# Patient Record
Sex: Male | Born: 1935 | Race: White | Hispanic: No | Smoking: Never smoker
Health system: Southern US, Community
[De-identification: ages and names within clinical notes are randomized; demographics above are authoritative.]

## PROBLEM LIST (undated history)

## (undated) DIAGNOSIS — K219 Gastro-esophageal reflux disease without esophagitis: Secondary | ICD-10-CM

## (undated) DIAGNOSIS — M1990 Unspecified osteoarthritis, unspecified site: Secondary | ICD-10-CM

## (undated) DIAGNOSIS — N400 Enlarged prostate without lower urinary tract symptoms: Secondary | ICD-10-CM

## (undated) DIAGNOSIS — F329 Major depressive disorder, single episode, unspecified: Secondary | ICD-10-CM

## (undated) DIAGNOSIS — I4891 Unspecified atrial fibrillation: Secondary | ICD-10-CM

## (undated) DIAGNOSIS — E119 Type 2 diabetes mellitus without complications: Secondary | ICD-10-CM

## (undated) DIAGNOSIS — N390 Urinary tract infection, site not specified: Secondary | ICD-10-CM

## (undated) DIAGNOSIS — I10 Essential (primary) hypertension: Secondary | ICD-10-CM

## (undated) DIAGNOSIS — F32A Depression, unspecified: Secondary | ICD-10-CM

## (undated) DIAGNOSIS — I2699 Other pulmonary embolism without acute cor pulmonale: Secondary | ICD-10-CM

## (undated) DIAGNOSIS — E785 Hyperlipidemia, unspecified: Secondary | ICD-10-CM

## (undated) DIAGNOSIS — H269 Unspecified cataract: Secondary | ICD-10-CM

## (undated) DIAGNOSIS — C61 Malignant neoplasm of prostate: Secondary | ICD-10-CM

## (undated) DIAGNOSIS — K4090 Unilateral inguinal hernia, without obstruction or gangrene, not specified as recurrent: Secondary | ICD-10-CM

## (undated) HISTORY — DX: Major depressive disorder, single episode, unspecified: F32.9

## (undated) HISTORY — DX: Malignant neoplasm of prostate: C61

## (undated) HISTORY — DX: Unilateral inguinal hernia, without obstruction or gangrene, not specified as recurrent: K40.90

## (undated) HISTORY — PX: OTHER SURGICAL HISTORY: SHX169

## (undated) HISTORY — DX: Essential (primary) hypertension: I10

## (undated) HISTORY — DX: Hyperlipidemia, unspecified: E78.5

## (undated) HISTORY — DX: Unspecified atrial fibrillation: I48.91

## (undated) HISTORY — DX: Unspecified cataract: H26.9

## (undated) HISTORY — PX: PROSTATE SURGERY: SHX751

## (undated) HISTORY — PX: TOOTH EXTRACTION: SUR596

## (undated) HISTORY — DX: Depression, unspecified: F32.A

## (undated) HISTORY — DX: Unspecified osteoarthritis, unspecified site: M19.90

## (undated) HISTORY — DX: Type 2 diabetes mellitus without complications: E11.9

## (undated) HISTORY — DX: Gastro-esophageal reflux disease without esophagitis: K21.9

## (undated) HISTORY — DX: Urinary tract infection, site not specified: N39.0

## (undated) HISTORY — DX: Benign prostatic hyperplasia without lower urinary tract symptoms: N40.0

## (undated) HISTORY — PX: EYE SURGERY: SHX253

## (undated) HISTORY — DX: Other pulmonary embolism without acute cor pulmonale: I26.99

---

## 1958-07-01 HISTORY — PX: OTHER SURGICAL HISTORY: SHX169

## 2004-02-09 ENCOUNTER — Other Ambulatory Visit: Payer: Self-pay

## 2004-03-31 ENCOUNTER — Encounter: Payer: Self-pay | Admitting: Internal Medicine

## 2004-05-01 ENCOUNTER — Encounter: Payer: Self-pay | Admitting: Internal Medicine

## 2004-05-31 ENCOUNTER — Encounter: Payer: Self-pay | Admitting: Internal Medicine

## 2004-07-01 ENCOUNTER — Encounter: Payer: Self-pay | Admitting: Internal Medicine

## 2004-08-09 ENCOUNTER — Encounter: Payer: Self-pay | Admitting: Internal Medicine

## 2004-08-29 ENCOUNTER — Encounter: Payer: Self-pay | Admitting: Internal Medicine

## 2004-09-29 ENCOUNTER — Encounter: Payer: Self-pay | Admitting: Internal Medicine

## 2004-10-29 ENCOUNTER — Encounter: Payer: Self-pay | Admitting: Internal Medicine

## 2004-11-29 ENCOUNTER — Encounter: Payer: Self-pay | Admitting: Internal Medicine

## 2004-12-29 ENCOUNTER — Encounter: Payer: Self-pay | Admitting: Internal Medicine

## 2005-01-29 ENCOUNTER — Encounter: Payer: Self-pay | Admitting: Internal Medicine

## 2005-03-01 ENCOUNTER — Encounter: Payer: Self-pay | Admitting: Internal Medicine

## 2005-04-22 ENCOUNTER — Encounter: Payer: Self-pay | Admitting: Internal Medicine

## 2005-05-01 ENCOUNTER — Encounter: Payer: Self-pay | Admitting: Internal Medicine

## 2005-05-31 ENCOUNTER — Encounter: Payer: Self-pay | Admitting: Internal Medicine

## 2005-07-01 ENCOUNTER — Encounter: Payer: Self-pay | Admitting: Cardiology

## 2005-07-22 ENCOUNTER — Encounter: Payer: Self-pay | Admitting: Cardiology

## 2005-08-01 ENCOUNTER — Encounter: Payer: Self-pay | Admitting: Cardiology

## 2005-08-29 ENCOUNTER — Encounter: Payer: Self-pay | Admitting: Cardiology

## 2005-10-01 ENCOUNTER — Other Ambulatory Visit: Payer: Self-pay

## 2005-10-01 ENCOUNTER — Emergency Department: Payer: Self-pay | Admitting: Emergency Medicine

## 2005-10-03 ENCOUNTER — Encounter: Payer: Self-pay | Admitting: Cardiology

## 2005-10-29 ENCOUNTER — Encounter: Payer: Self-pay | Admitting: Cardiology

## 2005-11-29 ENCOUNTER — Encounter: Payer: Self-pay | Admitting: Cardiology

## 2005-12-29 ENCOUNTER — Encounter: Payer: Self-pay | Admitting: Cardiology

## 2006-01-29 ENCOUNTER — Encounter: Payer: Self-pay | Admitting: Cardiology

## 2006-03-04 ENCOUNTER — Encounter: Payer: Self-pay | Admitting: Family

## 2006-03-28 DIAGNOSIS — E785 Hyperlipidemia, unspecified: Secondary | ICD-10-CM | POA: Insufficient documentation

## 2006-03-31 ENCOUNTER — Encounter: Payer: Self-pay | Admitting: Family

## 2006-05-09 ENCOUNTER — Encounter: Payer: Self-pay | Admitting: Family

## 2006-05-28 ENCOUNTER — Other Ambulatory Visit: Payer: Self-pay

## 2006-05-29 ENCOUNTER — Inpatient Hospital Stay: Payer: Self-pay | Admitting: Internal Medicine

## 2006-05-31 ENCOUNTER — Encounter: Payer: Self-pay | Admitting: Family

## 2006-07-01 ENCOUNTER — Encounter: Payer: Self-pay | Admitting: Family

## 2006-08-01 ENCOUNTER — Encounter: Payer: Self-pay | Admitting: Family

## 2006-08-30 ENCOUNTER — Encounter: Payer: Self-pay | Admitting: Family

## 2006-09-30 ENCOUNTER — Encounter: Payer: Self-pay | Admitting: Family

## 2006-10-30 ENCOUNTER — Encounter: Payer: Self-pay | Admitting: Family

## 2006-11-30 ENCOUNTER — Encounter: Payer: Self-pay | Admitting: Family

## 2006-12-30 ENCOUNTER — Encounter: Payer: Self-pay | Admitting: Family

## 2007-01-30 ENCOUNTER — Encounter: Payer: Self-pay | Admitting: Family

## 2007-03-02 ENCOUNTER — Encounter: Payer: Self-pay | Admitting: Family

## 2007-04-01 ENCOUNTER — Encounter: Payer: Self-pay | Admitting: Family

## 2007-06-02 ENCOUNTER — Ambulatory Visit: Payer: Self-pay | Admitting: Family Medicine

## 2007-07-06 ENCOUNTER — Encounter: Payer: Self-pay | Admitting: Family Medicine

## 2007-08-02 ENCOUNTER — Encounter: Payer: Self-pay | Admitting: Family Medicine

## 2007-08-30 ENCOUNTER — Encounter: Payer: Self-pay | Admitting: Family Medicine

## 2007-10-07 ENCOUNTER — Encounter: Payer: Self-pay | Admitting: Family Medicine

## 2007-10-30 ENCOUNTER — Encounter: Payer: Self-pay | Admitting: Family Medicine

## 2007-11-30 ENCOUNTER — Encounter: Payer: Self-pay | Admitting: Family Medicine

## 2007-12-30 ENCOUNTER — Encounter: Payer: Self-pay | Admitting: Family Medicine

## 2008-01-30 ENCOUNTER — Encounter: Payer: Self-pay | Admitting: Family Medicine

## 2008-03-01 ENCOUNTER — Encounter: Payer: Self-pay | Admitting: Family Medicine

## 2008-03-31 ENCOUNTER — Encounter: Payer: Self-pay | Admitting: Family Medicine

## 2008-05-01 ENCOUNTER — Encounter: Payer: Self-pay | Admitting: Family Medicine

## 2008-05-18 ENCOUNTER — Ambulatory Visit: Payer: Self-pay | Admitting: Family Medicine

## 2008-05-31 ENCOUNTER — Encounter: Payer: Self-pay | Admitting: Family Medicine

## 2008-07-01 ENCOUNTER — Ambulatory Visit: Payer: Self-pay | Admitting: Family Medicine

## 2008-07-29 ENCOUNTER — Encounter: Payer: Self-pay | Admitting: Family Medicine

## 2008-08-01 ENCOUNTER — Encounter: Payer: Self-pay | Admitting: Family Medicine

## 2008-08-30 ENCOUNTER — Encounter: Payer: Self-pay | Admitting: Family Medicine

## 2008-09-29 ENCOUNTER — Encounter: Payer: Self-pay | Admitting: Family Medicine

## 2008-10-29 ENCOUNTER — Encounter: Payer: Self-pay | Admitting: Family Medicine

## 2008-11-29 ENCOUNTER — Encounter: Payer: Self-pay | Admitting: Family Medicine

## 2008-12-29 ENCOUNTER — Encounter: Payer: Self-pay | Admitting: Family Medicine

## 2009-01-29 ENCOUNTER — Encounter: Payer: Self-pay | Admitting: Family Medicine

## 2009-03-01 ENCOUNTER — Encounter: Payer: Self-pay | Admitting: Family Medicine

## 2009-03-31 ENCOUNTER — Encounter: Payer: Self-pay | Admitting: Family Medicine

## 2009-05-01 ENCOUNTER — Encounter: Payer: Self-pay | Admitting: Family Medicine

## 2009-05-11 DIAGNOSIS — E7800 Pure hypercholesterolemia, unspecified: Secondary | ICD-10-CM | POA: Insufficient documentation

## 2009-05-31 ENCOUNTER — Encounter: Payer: Self-pay | Admitting: Family Medicine

## 2009-07-03 ENCOUNTER — Encounter: Payer: Self-pay | Admitting: Family Medicine

## 2009-08-01 ENCOUNTER — Encounter: Payer: Self-pay | Admitting: Family Medicine

## 2009-08-29 ENCOUNTER — Encounter: Payer: Self-pay | Admitting: Family Medicine

## 2009-09-29 ENCOUNTER — Encounter: Payer: Self-pay | Admitting: Family Medicine

## 2009-10-29 ENCOUNTER — Encounter: Payer: Self-pay | Admitting: Family Medicine

## 2009-11-28 ENCOUNTER — Ambulatory Visit: Payer: Self-pay | Admitting: Ophthalmology

## 2009-12-05 ENCOUNTER — Encounter: Payer: Self-pay | Admitting: Family Medicine

## 2009-12-29 ENCOUNTER — Encounter: Payer: Self-pay | Admitting: Family Medicine

## 2010-01-29 ENCOUNTER — Other Ambulatory Visit: Payer: Self-pay | Admitting: Family Medicine

## 2010-03-01 ENCOUNTER — Other Ambulatory Visit: Payer: Self-pay | Admitting: Family Medicine

## 2010-03-31 ENCOUNTER — Other Ambulatory Visit: Payer: Self-pay | Admitting: Family Medicine

## 2010-05-01 ENCOUNTER — Other Ambulatory Visit: Payer: Self-pay | Admitting: Family Medicine

## 2010-05-31 ENCOUNTER — Other Ambulatory Visit: Payer: Self-pay | Admitting: Family Medicine

## 2010-07-01 ENCOUNTER — Other Ambulatory Visit: Payer: Self-pay | Admitting: Family Medicine

## 2010-08-01 ENCOUNTER — Other Ambulatory Visit: Payer: Self-pay | Admitting: Family Medicine

## 2010-08-01 ENCOUNTER — Ambulatory Visit: Payer: Self-pay | Admitting: Family Medicine

## 2010-08-30 ENCOUNTER — Other Ambulatory Visit: Payer: Self-pay | Admitting: Family Medicine

## 2010-09-30 ENCOUNTER — Other Ambulatory Visit: Payer: Self-pay | Admitting: Family Medicine

## 2010-10-30 ENCOUNTER — Other Ambulatory Visit: Payer: Self-pay | Admitting: Family Medicine

## 2010-11-20 ENCOUNTER — Ambulatory Visit: Payer: Self-pay | Admitting: Family Medicine

## 2010-11-30 ENCOUNTER — Other Ambulatory Visit: Payer: Self-pay | Admitting: Family Medicine

## 2010-12-30 ENCOUNTER — Other Ambulatory Visit: Payer: Self-pay | Admitting: Family Medicine

## 2011-01-23 ENCOUNTER — Ambulatory Visit: Payer: Self-pay | Admitting: Ophthalmology

## 2011-01-30 ENCOUNTER — Other Ambulatory Visit: Payer: Self-pay | Admitting: Family Medicine

## 2011-02-05 ENCOUNTER — Ambulatory Visit: Payer: Self-pay | Admitting: Ophthalmology

## 2011-03-02 ENCOUNTER — Other Ambulatory Visit: Payer: Self-pay | Admitting: Family Medicine

## 2011-04-01 ENCOUNTER — Other Ambulatory Visit: Payer: Self-pay | Admitting: Family Medicine

## 2011-05-02 ENCOUNTER — Other Ambulatory Visit: Payer: Self-pay | Admitting: Family Medicine

## 2011-06-01 ENCOUNTER — Other Ambulatory Visit: Payer: Self-pay | Admitting: Family Medicine

## 2011-07-29 ENCOUNTER — Other Ambulatory Visit: Payer: Self-pay | Admitting: Family Medicine

## 2011-08-02 ENCOUNTER — Other Ambulatory Visit: Payer: Self-pay | Admitting: Family Medicine

## 2011-08-30 ENCOUNTER — Other Ambulatory Visit: Payer: Self-pay | Admitting: Family Medicine

## 2011-09-06 LAB — PROTIME-INR: INR: 3.1

## 2011-09-27 LAB — PROTIME-INR
INR: 3.2
Prothrombin Time: 32.5 secs — ABNORMAL HIGH (ref 11.5–14.7)

## 2011-09-30 ENCOUNTER — Other Ambulatory Visit: Payer: Self-pay | Admitting: Family Medicine

## 2011-11-15 ENCOUNTER — Other Ambulatory Visit: Payer: Self-pay | Admitting: Family Medicine

## 2011-11-15 LAB — PROTIME-INR: INR: 2

## 2011-11-30 ENCOUNTER — Other Ambulatory Visit: Payer: Self-pay | Admitting: Family Medicine

## 2011-12-18 LAB — PROTIME-INR
INR: 1.8
Prothrombin Time: 21.2 secs — ABNORMAL HIGH (ref 11.5–14.7)

## 2011-12-30 ENCOUNTER — Other Ambulatory Visit: Payer: Self-pay | Admitting: Family Medicine

## 2012-01-15 LAB — PROTIME-INR: Prothrombin Time: 23.3 secs — ABNORMAL HIGH (ref 11.5–14.7)

## 2012-01-30 ENCOUNTER — Other Ambulatory Visit: Payer: Self-pay | Admitting: Family Medicine

## 2012-02-14 LAB — PROTIME-INR: Prothrombin Time: 26.7 secs — ABNORMAL HIGH (ref 11.5–14.7)

## 2012-03-01 ENCOUNTER — Other Ambulatory Visit: Payer: Self-pay | Admitting: Family Medicine

## 2012-03-31 ENCOUNTER — Other Ambulatory Visit: Payer: Self-pay | Admitting: Family Medicine

## 2012-04-16 LAB — PROTIME-INR
INR: 1.7
Prothrombin Time: 20.4 secs — ABNORMAL HIGH (ref 11.5–14.7)

## 2012-05-01 ENCOUNTER — Other Ambulatory Visit: Payer: Self-pay | Admitting: Family Medicine

## 2012-05-31 ENCOUNTER — Other Ambulatory Visit: Payer: Self-pay | Admitting: Family Medicine

## 2012-06-09 LAB — PROTIME-INR
INR: 2.1
Prothrombin Time: 24.2 secs — ABNORMAL HIGH (ref 11.5–14.7)

## 2012-07-01 ENCOUNTER — Other Ambulatory Visit: Payer: Self-pay | Admitting: Family Medicine

## 2012-07-13 LAB — PROTIME-INR
INR: 1.6
Prothrombin Time: 19.6 secs — ABNORMAL HIGH (ref 11.5–14.7)

## 2012-07-21 LAB — PROTIME-INR
INR: 2.9
Prothrombin Time: 30.7 secs — ABNORMAL HIGH (ref 11.5–14.7)

## 2012-08-01 ENCOUNTER — Other Ambulatory Visit: Payer: Self-pay | Admitting: Family Medicine

## 2012-08-14 LAB — PROTIME-INR: INR: 4

## 2012-08-29 ENCOUNTER — Other Ambulatory Visit: Payer: Self-pay | Admitting: Family Medicine

## 2012-09-28 LAB — PROTIME-INR: Prothrombin Time: 26.9 secs — ABNORMAL HIGH (ref 11.5–14.7)

## 2012-09-29 ENCOUNTER — Other Ambulatory Visit: Payer: Self-pay | Admitting: Family Medicine

## 2012-10-12 ENCOUNTER — Ambulatory Visit: Payer: Self-pay | Admitting: Family Medicine

## 2012-10-28 LAB — PROTIME-INR
INR: 2.6
Prothrombin Time: 27.1 secs — ABNORMAL HIGH (ref 11.5–14.7)

## 2012-10-29 ENCOUNTER — Other Ambulatory Visit: Payer: Self-pay | Admitting: Family Medicine

## 2012-11-25 LAB — PROTIME-INR: INR: 2.4

## 2012-11-29 ENCOUNTER — Other Ambulatory Visit: Payer: Self-pay | Admitting: Family Medicine

## 2012-12-28 LAB — PROTIME-INR
INR: 2.6
Prothrombin Time: 27 secs — ABNORMAL HIGH (ref 11.5–14.7)

## 2012-12-29 ENCOUNTER — Other Ambulatory Visit: Payer: Self-pay | Admitting: Family Medicine

## 2013-01-27 LAB — PROTIME-INR
INR: 2.6
Prothrombin Time: 26.9 secs — ABNORMAL HIGH (ref 11.5–14.7)

## 2013-01-29 ENCOUNTER — Other Ambulatory Visit: Payer: Self-pay | Admitting: Family Medicine

## 2013-03-02 ENCOUNTER — Other Ambulatory Visit: Payer: Self-pay | Admitting: Family Medicine

## 2013-03-02 LAB — PROTIME-INR: INR: 4.1

## 2013-03-09 LAB — PROTIME-INR: INR: 1.5

## 2013-03-25 LAB — PROTIME-INR
INR: 1.9
Prothrombin Time: 21.4 secs — ABNORMAL HIGH (ref 11.5–14.7)

## 2013-03-31 ENCOUNTER — Other Ambulatory Visit: Payer: Self-pay | Admitting: Family Medicine

## 2013-04-27 LAB — PROTIME-INR: Prothrombin Time: 21.2 secs — ABNORMAL HIGH (ref 11.5–14.7)

## 2013-05-01 ENCOUNTER — Other Ambulatory Visit: Payer: Self-pay | Admitting: Family Medicine

## 2013-05-28 LAB — PROTIME-INR: Prothrombin Time: 20.2 secs — ABNORMAL HIGH (ref 11.5–14.7)

## 2013-05-31 ENCOUNTER — Other Ambulatory Visit: Payer: Self-pay | Admitting: Family Medicine

## 2013-07-01 ENCOUNTER — Other Ambulatory Visit: Payer: Self-pay | Admitting: Family Medicine

## 2013-07-13 LAB — PROTIME-INR
INR: 1.8
PROTHROMBIN TIME: 20.9 s — AB (ref 11.5–14.7)

## 2013-08-01 ENCOUNTER — Other Ambulatory Visit: Payer: Self-pay | Admitting: Family Medicine

## 2013-08-13 LAB — APTT: Activated PTT: 45.6 secs — ABNORMAL HIGH (ref 23.6–35.9)

## 2013-08-18 ENCOUNTER — Other Ambulatory Visit: Payer: Self-pay | Admitting: Family Medicine

## 2013-08-18 LAB — PROTIME-INR
INR: 2.8
Prothrombin Time: 28.8 secs — ABNORMAL HIGH (ref 11.5–14.7)

## 2013-08-29 ENCOUNTER — Other Ambulatory Visit: Payer: Self-pay | Admitting: Family Medicine

## 2013-09-09 LAB — PROTIME-INR
INR: 2.5
PROTHROMBIN TIME: 26 s — AB (ref 11.5–14.7)

## 2013-09-29 ENCOUNTER — Other Ambulatory Visit: Payer: Self-pay | Admitting: Family Medicine

## 2013-10-13 ENCOUNTER — Other Ambulatory Visit: Payer: Self-pay | Admitting: Family Medicine

## 2013-10-13 LAB — COMPREHENSIVE METABOLIC PANEL
ALBUMIN: 3.4 g/dL (ref 3.4–5.0)
ALT: 28 U/L (ref 12–78)
ANION GAP: 4 — AB (ref 7–16)
Alkaline Phosphatase: 52 U/L
BUN: 23 mg/dL — ABNORMAL HIGH (ref 7–18)
Bilirubin,Total: 0.5 mg/dL (ref 0.2–1.0)
CALCIUM: 9.1 mg/dL (ref 8.5–10.1)
CO2: 31 mmol/L (ref 21–32)
Chloride: 105 mmol/L (ref 98–107)
Creatinine: 1 mg/dL (ref 0.60–1.30)
EGFR (African American): >60
GLUCOSE: 152 mg/dL — AB (ref 65–99)
Osmolality: 286 (ref 275–301)
POTASSIUM: 4.6 mmol/L (ref 3.5–5.1)
SGOT(AST): 24 U/L (ref 15–37)
Sodium: 140 mmol/L (ref 136–145)
Total Protein: 6.9 g/dL (ref 6.4–8.2)

## 2013-10-13 LAB — LIPID PANEL
CHOLESTEROL: 128 mg/dL (ref 0–200)
HDL Cholesterol: 45 mg/dL (ref 40–60)
LDL CHOLESTEROL, CALC: 51 mg/dL (ref 0–100)
Triglycerides: 159 mg/dL (ref 0–200)
VLDL CHOLESTEROL, CALC: 32 mg/dL (ref 5–40)

## 2013-10-13 LAB — CBC WITH DIFFERENTIAL/PLATELET
BASOS PCT: 0.7 %
Basophil #: 0.1 10*3/uL (ref 0.0–0.1)
EOS ABS: 0.5 10*3/uL (ref 0.0–0.7)
EOS PCT: 6.9 %
HCT: 45.2 % (ref 40.0–52.0)
HGB: 14.2 g/dL (ref 13.0–18.0)
LYMPHS ABS: 1.6 10*3/uL (ref 1.0–3.6)
Lymphocyte %: 22.1 %
MCH: 27.4 pg (ref 26.0–34.0)
MCHC: 31.5 g/dL — AB (ref 32.0–36.0)
MCV: 87 fL (ref 80–100)
MONO ABS: 0.7 x10 3/mm (ref 0.2–1.0)
Monocyte %: 9.3 %
NEUTROS PCT: 61 %
Neutrophil #: 4.5 10*3/uL (ref 1.4–6.5)
PLATELETS: 247 10*3/uL (ref 150–440)
RBC: 5.2 10*6/uL (ref 4.40–5.90)
RDW: 13.5 % (ref 11.5–14.5)
WBC: 7.3 10*3/uL (ref 3.8–10.6)

## 2013-10-13 LAB — TSH: THYROID STIMULATING HORM: 2.57 u[IU]/mL

## 2013-10-13 LAB — HEMOGLOBIN A1C: HEMOGLOBIN A1C: 7.3 % — AB (ref 4.2–6.3)

## 2014-10-10 LAB — TSH: TSH: 3.01 u[IU]/mL (ref <=5.90)

## 2014-10-10 LAB — CBC AND DIFFERENTIAL
HCT: 46 % (ref 41–53)
HEMOGLOBIN: 15.4 g/dL (ref 13.5–17.5)
Platelets: 223 10*3/uL (ref 150–399)
WBC: 7 10^3/mL

## 2014-10-10 LAB — HEPATIC FUNCTION PANEL
ALT: 25 U/L (ref 10–40)
AST: 26 U/L (ref 14–40)

## 2014-10-10 LAB — BASIC METABOLIC PANEL
BUN: 19 mg/dL (ref 4–21)
Creatinine: 0.9 mg/dL (ref <=1.3)
GLUCOSE: 134 mg/dL
POTASSIUM: 4.4 mmol/L (ref 3.4–5.3)
Sodium: 142 mmol/L (ref 137–147)

## 2014-10-10 LAB — HEMOGLOBIN A1C: Hgb A1c MFr Bld: 7.8 % — AB (ref 4.0–6.0)

## 2014-10-10 LAB — LIPID PANEL
Cholesterol: 137 mg/dL (ref 0–200)
HDL: 54 mg/dL (ref 35–70)
LDL CALC: 56 mg/dL
Triglycerides: 133 mg/dL (ref 40–160)

## 2014-11-23 LAB — PROTIME-INR: Protime: 47 seconds — AB (ref 10.0–13.8)

## 2014-11-23 LAB — POCT INR: INR: 3.9 — AB (ref <=1.1)

## 2014-12-01 ENCOUNTER — Encounter: Payer: Self-pay | Admitting: Family Medicine

## 2014-12-01 ENCOUNTER — Other Ambulatory Visit: Payer: Self-pay | Admitting: Family Medicine

## 2014-12-01 DIAGNOSIS — F329 Major depressive disorder, single episode, unspecified: Secondary | ICD-10-CM | POA: Insufficient documentation

## 2014-12-01 DIAGNOSIS — L821 Other seborrheic keratosis: Secondary | ICD-10-CM | POA: Insufficient documentation

## 2014-12-01 DIAGNOSIS — I2699 Other pulmonary embolism without acute cor pulmonale: Secondary | ICD-10-CM

## 2014-12-01 DIAGNOSIS — M1990 Unspecified osteoarthritis, unspecified site: Secondary | ICD-10-CM | POA: Insufficient documentation

## 2014-12-01 DIAGNOSIS — H269 Unspecified cataract: Secondary | ICD-10-CM | POA: Insufficient documentation

## 2014-12-01 DIAGNOSIS — I4891 Unspecified atrial fibrillation: Secondary | ICD-10-CM | POA: Insufficient documentation

## 2014-12-01 DIAGNOSIS — H9193 Unspecified hearing loss, bilateral: Secondary | ICD-10-CM | POA: Insufficient documentation

## 2014-12-01 DIAGNOSIS — F32A Depression, unspecified: Secondary | ICD-10-CM | POA: Insufficient documentation

## 2014-12-01 DIAGNOSIS — K2100 Gastro-esophageal reflux disease with esophagitis, without bleeding: Secondary | ICD-10-CM | POA: Insufficient documentation

## 2014-12-01 DIAGNOSIS — N400 Enlarged prostate without lower urinary tract symptoms: Secondary | ICD-10-CM | POA: Insufficient documentation

## 2014-12-01 DIAGNOSIS — D649 Anemia, unspecified: Secondary | ICD-10-CM | POA: Insufficient documentation

## 2014-12-01 DIAGNOSIS — Z86711 Personal history of pulmonary embolism: Secondary | ICD-10-CM | POA: Insufficient documentation

## 2014-12-01 DIAGNOSIS — R5383 Other fatigue: Secondary | ICD-10-CM | POA: Insufficient documentation

## 2014-12-01 DIAGNOSIS — G3184 Mild cognitive impairment, so stated: Secondary | ICD-10-CM | POA: Insufficient documentation

## 2014-12-02 LAB — PROTIME-INR
INR: 1.8 — ABNORMAL HIGH (ref 0.8–1.2)
PROTHROMBIN TIME: 18.5 s — AB (ref 9.1–12.0)

## 2014-12-05 ENCOUNTER — Telehealth: Payer: Self-pay

## 2014-12-06 ENCOUNTER — Telehealth: Payer: Self-pay

## 2014-12-06 MED ORDER — WARFARIN SODIUM 3 MG PO TABS: 3.0000 mg | ORAL_TABLET | Freq: Every day | ORAL | Status: DC

## 2014-12-06 NOTE — Telephone Encounter (Signed)
Patient has been advised by other staff member.

## 2014-12-06 NOTE — Telephone Encounter (Signed)
Spoke with Dr. [name] and he wants patient to take 2 mg daily except 3 mg on Tuesday and Saturday. Patient advised and understood. Confirmed it with patient 100%. ADvised patient to re check his level in 2 weeks. RX for 3 mg dose sent in to the pharmacy.

## 2014-12-16 ENCOUNTER — Ambulatory Visit (INDEPENDENT_AMBULATORY_CARE_PROVIDER_SITE_OTHER): Payer: Medicare Other

## 2014-12-16 DIAGNOSIS — I4891 Unspecified atrial fibrillation: Secondary | ICD-10-CM | POA: Diagnosis not present

## 2014-12-16 LAB — POCT INR
INR: 3.3
PT: 39.8

## 2014-12-16 NOTE — Patient Instructions (Signed)
Advised per MD's order to HOLD Warfarin today.  Take warfarin 2 mg Daily except 3 mg on Tuesdays. Come back in 2 weeks for a PT/INR check. Call if any questions or concerns.

## 2014-12-30 ENCOUNTER — Ambulatory Visit (INDEPENDENT_AMBULATORY_CARE_PROVIDER_SITE_OTHER): Payer: Medicare Other

## 2014-12-30 DIAGNOSIS — I4891 Unspecified atrial fibrillation: Secondary | ICD-10-CM | POA: Diagnosis not present

## 2014-12-30 LAB — POCT INR
INR: 2.2
PT: 26.2

## 2014-12-30 NOTE — Patient Instructions (Signed)
Continue same dose. Take warfarin 2 mg Daily, except 3 mg on Tuesdays. Come back in 4 weeks for a PT/INR check. Call if any questions or concerns.

## 2015-01-27 ENCOUNTER — Ambulatory Visit (INDEPENDENT_AMBULATORY_CARE_PROVIDER_SITE_OTHER): Payer: Medicare Other

## 2015-01-27 DIAGNOSIS — I4891 Unspecified atrial fibrillation: Secondary | ICD-10-CM | POA: Diagnosis not present

## 2015-01-27 LAB — POCT INR
INR: 2.3
Protime: 27.3 seconds

## 2015-01-27 NOTE — Patient Instructions (Signed)
Continue same dose, 2mg  Daily except 3 mg on Tuesdays. Come back in 4 weeks. Call if any questions or concern, please call.

## 2015-02-24 ENCOUNTER — Ambulatory Visit (INDEPENDENT_AMBULATORY_CARE_PROVIDER_SITE_OTHER): Payer: Medicare Other

## 2015-02-24 DIAGNOSIS — I4891 Unspecified atrial fibrillation: Secondary | ICD-10-CM | POA: Diagnosis not present

## 2015-02-24 LAB — POCT INR: INR: 3.2

## 2015-02-24 NOTE — Patient Instructions (Signed)
Skip Coumadin today and then take  2mg  Daily. Come back in 2 weeks. Call if any questions or concern, please call.

## 2015-03-10 ENCOUNTER — Ambulatory Visit: Payer: Medicare Other

## 2015-03-16 ENCOUNTER — Encounter: Payer: Self-pay | Admitting: Family Medicine

## 2015-03-16 ENCOUNTER — Ambulatory Visit (INDEPENDENT_AMBULATORY_CARE_PROVIDER_SITE_OTHER): Payer: Medicare Other | Admitting: Family Medicine

## 2015-03-16 VITALS — BP 124/70 | HR 78 | Temp 97.4°F | Resp 16 | Wt 178.0 lb

## 2015-03-16 DIAGNOSIS — Z23 Encounter for immunization: Secondary | ICD-10-CM

## 2015-03-16 DIAGNOSIS — I10 Essential (primary) hypertension: Secondary | ICD-10-CM

## 2015-03-16 DIAGNOSIS — R413 Other amnesia: Secondary | ICD-10-CM | POA: Diagnosis not present

## 2015-03-16 DIAGNOSIS — E118 Type 2 diabetes mellitus with unspecified complications: Secondary | ICD-10-CM | POA: Diagnosis not present

## 2015-03-16 DIAGNOSIS — I2699 Other pulmonary embolism without acute cor pulmonale: Secondary | ICD-10-CM | POA: Diagnosis not present

## 2015-03-16 LAB — POCT INR: INR: 2.4

## 2015-03-16 LAB — POCT GLYCOSYLATED HEMOGLOBIN (HGB A1C): Hemoglobin A1C: 7.4

## 2015-03-16 NOTE — Progress Notes (Signed)
Patient ID: Tyler Kirk, male   DOB: [birth year], 81 y.o.   MRN: [id number]    Subjective:  HPI  Diabetes Mellitus Type II, Follow-up:   Lab Results  Component Value Date   HGBA1C 7.8* 10/10/2014   HGBA1C 7.3* 10/13/2013    Last seen for diabetes 5 months ago.  Management since then includes none. He reports good compliance with treatment. He is not having side effects.  Current symptoms include none. Home blood sugar records: fasting in the 120's  Episodes of hypoglycemia? no   Current Insulin Regimen: n/a Most Recent Eye Exam: within the last year Current exercise: walking  Pertinent Labs:    Component Value Date/Time   CHOL 137 10/10/2014   CHOL 128 10/13/2013 0814   TRIG 133 10/10/2014   TRIG 159 10/13/2013 0814   CREATININE 0.9 10/10/2014   CREATININE 1.00 10/13/2013 0814    Wt Readings from Last 3 Encounters:  03/16/15 178 lb (80.74 kg)  10/06/14 178 lb (80.74 kg)    ------------------------------------------------------------------------    Hypertension, follow-up:  BP Readings from Last 3 Encounters:  03/16/15 124/70  10/06/14 120/68    He was last seen for hypertension 5 months ago.  BP at that visit was 120/68. Management since that visit includes none. He reports good compliance with treatment. He is not having side effects.  He is exercising. He is adherent to low salt diet.   Outside blood pressures are not being checked. He is experiencing none.    Wt Readings from Last 3 Encounters:  03/16/15 178 lb (80.74 kg)  10/06/14 178 lb (80.74 kg)    ------------------------------------------------------------------------  PT- 29.3 INR- 2.4   He is currently taking 2 mg daily. Last PT was elevated at 3.2       Prior to Admission medications   Medication Sig Start Date End Date Taking? Authorizing Provider  fluticasone (FLONASE) 50 MCG/ACT nasal spray Place into the nose. 06/07/14  Yes Historical Provider, MD  glucose blood  test strip ACCU-CHEK ACTIVE (In Vitro Strip)  1 (one) Strip Strip check sugar once daily for 0 days  Quantity: 50;  Refills: 0   Ordered :07-Jun-2014  Celene Kras, MA, Anastasiya ;  Started 03-Jun-2014 Active Comments: faxed over on paper-aa 06/03/2014 E11.9 06/03/14  Yes Historical Provider, MD  metFORMIN (GLUCOPHAGE) 500 MG tablet Take by mouth. 10/27/14  Yes Historical Provider, MD  MULTIPLE VITAMIN PO Take by mouth. 08/01/10  Yes Historical Provider, MD  simvastatin (ZOCOR) 20 MG tablet Take by mouth. 03/23/14  Yes Historical Provider, MD  tamsulosin (FLOMAX) 0.4 MG CAPS capsule Take by mouth. 10/27/14  Yes Historical Provider, MD  triamcinolone ointment (KENALOG) 0.1 % TRIAMCINOLONE ACETONIDE, 0.1% (External Ointment)  small amount apply to affected area as needed for 0 days  Quantity: 61;  Refills: 0   Ordered :06-Oct-2014  Felton Clinton, [place] ;  Started 06-Oct-2014 Active Comments: Medication taken as needed. 10/06/14  Yes Historical Provider, MD  warfarin (COUMADIN) 2 MG tablet Take by mouth. 04/15/14  Yes Historical Provider, MD  warfarin (COUMADIN) 3 MG tablet Take 1 tablet (3 mg total) by mouth daily. 12/06/14  Yes Voris Tigert Maceo Pro., MD  warfarin (COUMADIN) 5 MG tablet Take by mouth. 08/23/11  Yes Historical Provider, MD    Patient Active Problem List   Diagnosis Date Noted  . Absolute anemia 12/01/2014  . Arthritis 12/01/2014  . A-fib 12/01/2014  . Benign fibroma of prostate 12/01/2014  . Cataract 12/01/2014  . Clinical depression 12/01/2014  .  Esophagitis, reflux 12/01/2014  . Fatigue 12/01/2014  . Bilateral hearing loss 12/01/2014  . Hyperplasia, prostate 12/01/2014  . Memory loss 12/01/2014  . PE (pulmonary embolism) 12/01/2014  . Basal cell papilloma 12/01/2014  . Dermatologic disease 10/17/2009  . Hypercholesterolemia without hypertriglyceridemia 05/11/2009  . Hernia, inguinal, unilateral 05/09/2006  . BP (high blood pressure) 03/28/2006  . HLD (hyperlipidemia) 03/28/2006  . Diabetes  mellitus, type 2 03/28/2006    Past Medical History  Diagnosis Date  . Depression   . Diabetes mellitus without complication   . Hypertension   . Hyperlipidemia   . Pulmonary embolism   . Afib   . GERD (gastroesophageal reflux disease)   . Hernia, inguinal     Chronic Large Inguinal Hernia  . Arthritis   . BPH (benign prostatic hypertrophy)     Social History   Social History  . Marital Status: Widowed    Spouse Name: N/A  . Number of Children: N/A  . Years of Education: N/A   Occupational History  . Not on file.   Social History Main Topics  . Smoking status: Never Smoker   . Smokeless tobacco: Not on file  . Alcohol Use: No  . Drug Use: No  . Sexual Activity: Not on file   Other Topics Concern  . Not on file   Social History Narrative    No Known Allergies  Review of Systems  Constitutional: Negative.   HENT: Negative.   Eyes: Negative.   Respiratory: Negative.   Cardiovascular: Negative.   Gastrointestinal: Negative.   Genitourinary: Negative.   Musculoskeletal: Negative.   Skin: Negative.   Neurological: Negative.   Endo/Heme/Allergies: Negative.   Psychiatric/Behavioral: Negative.     Immunization History  Administered Date(s) Administered  . Pneumococcal Conjugate-13 06/07/2014  . Pneumococcal Polysaccharide-23 03/24/2012  . Td 08/02/2010   Objective:  BP 124/70 mmHg  Pulse 78  Temp(Src) 97.4 F (36.3 C) (Oral)  Resp 16  Wt 178 lb (80.74 kg)  Physical Exam  Constitutional: He is oriented to person, place, and time and well-developed, well-nourished, and in no distress.  HENT:  Head: Normocephalic and atraumatic.  Right Ear: External ear normal.  Left Ear: External ear normal.  Nose: Nose normal.  Eyes: Conjunctivae are normal.  Neck: Neck supple.  Cardiovascular: Normal rate, regular rhythm and normal heart sounds.   Pulmonary/Chest: Effort normal.  Abdominal: Soft.  Neurological: He is alert and oriented to person, place, and  time.  Skin: Skin is warm and dry.  Very fair skin, significant skin damage on the forearms and the back of the hands. Also changes on the nose and possible basal cell carcinoma on the right side of the nose.  Psychiatric: Mood, affect and judgment normal.    Lab Results  Component Value Date   WBC 7.0 10/10/2014   HGB 15.4 10/10/2014   HCT 46 10/10/2014   PLT 223 10/10/2014   GLUCOSE 152* 10/13/2013   CHOL 137 10/10/2014   TRIG 133 10/10/2014   HDL 54 10/10/2014   LDLCALC 56 10/10/2014   TSH 3.01 10/10/2014   INR 3.2 02/24/2015   HGBA1C 7.8* 10/10/2014    CMP     Component Value Date/Time   NA 142 10/10/2014   NA 140 10/13/2013 0814   K 4.4 10/10/2014   K 4.6 10/13/2013 0814   CL 105 10/13/2013 0814   CO2 31 10/13/2013 0814   GLUCOSE 152* 10/13/2013 0814   BUN 19 10/10/2014   BUN 23* 10/13/2013 1610  CREATININE 0.9 10/10/2014   CREATININE 1.00 10/13/2013 0814   CALCIUM 9.1 10/13/2013 0814   PROT 6.9 10/13/2013 0814   ALBUMIN 3.4 10/13/2013 0814   AST 26 10/10/2014   AST 24 10/13/2013 0814   ALT 25 10/10/2014   ALT 28 10/13/2013 0814   ALKPHOS 52 10/13/2013 0814   BILITOT 0.5 10/13/2013 0814   GFRNONAA >60 10/13/2013 0814   GFRAA >60 10/13/2013 0814    Assessment and Plan :  1. Type 2 diabetes mellitus with complication No hypoglycemic episodes. Goal A1C in 81 yo is less than 8.5. - POCT HgB A1C--7.4  2. Essential hypertension  - EKG 12-Lead  3. PE (pulmonary embolism) Lifelong anticoagulant per hematology.  - POCT INR  4. Memory loss MCI MMSE on next OV.   5. Need for influenza vaccination  - Flu vaccine HIGH DOSE PF   Miguel Aschoff MD [facility] 03/16/2015 2:01 PM

## 2015-03-18 ENCOUNTER — Other Ambulatory Visit: Payer: Self-pay | Admitting: Family Medicine

## 2015-03-18 DIAGNOSIS — E7800 Pure hypercholesterolemia, unspecified: Secondary | ICD-10-CM

## 2015-03-20 NOTE — Telephone Encounter (Signed)
Last ov was on 03/16/2015. Next ov appointment is on 08/24/2015.  Thanks,

## 2015-03-20 NOTE — Telephone Encounter (Signed)
Pt called to see if RX for simvastatin (ZOCOR) 20 MG tablet to [facility] had been sent in. Pt would like a nurse to let him know when it has been sent in. Thanks TNP

## 2015-03-21 ENCOUNTER — Other Ambulatory Visit: Payer: Self-pay

## 2015-03-21 DIAGNOSIS — E7800 Pure hypercholesterolemia, unspecified: Secondary | ICD-10-CM

## 2015-03-21 MED ORDER — SIMVASTATIN 20 MG PO TABS: 20.0000 mg | ORAL_TABLET | Freq: Every day | ORAL | Status: DC

## 2015-04-14 ENCOUNTER — Ambulatory Visit (INDEPENDENT_AMBULATORY_CARE_PROVIDER_SITE_OTHER): Payer: Medicare Other

## 2015-04-14 DIAGNOSIS — I4891 Unspecified atrial fibrillation: Secondary | ICD-10-CM

## 2015-04-14 LAB — POCT INR
INR: 2.8
PT: 26.4

## 2015-04-14 MED ORDER — WARFARIN SODIUM 2 MG PO TABS: 2.0000 mg | ORAL_TABLET | Freq: Every day | ORAL | Status: DC

## 2015-04-14 NOTE — Patient Instructions (Signed)
Continue current dose:  2mg  Daily.

## 2015-05-12 ENCOUNTER — Ambulatory Visit (INDEPENDENT_AMBULATORY_CARE_PROVIDER_SITE_OTHER): Payer: Medicare Other

## 2015-05-12 DIAGNOSIS — I4891 Unspecified atrial fibrillation: Secondary | ICD-10-CM | POA: Diagnosis not present

## 2015-05-12 LAB — POCT INR: INR: 2.3

## 2015-05-12 NOTE — Patient Instructions (Signed)
Anticoagulation Dose Instructions as of 05/12/2015      Tyler Kirk Tue Wed Thu Fri Sat   New Dose 2 mg 2 mg 2 mg 2 mg 2 mg 2 mg 2 mg    Description        Continue current dose:  2mg  Daily.

## 2015-06-09 ENCOUNTER — Ambulatory Visit (INDEPENDENT_AMBULATORY_CARE_PROVIDER_SITE_OTHER): Payer: Medicare Other

## 2015-06-09 ENCOUNTER — Telehealth: Payer: Self-pay | Admitting: Family Medicine

## 2015-06-09 DIAGNOSIS — I4891 Unspecified atrial fibrillation: Secondary | ICD-10-CM

## 2015-06-09 LAB — POCT INR
INR: 2.1
PT: 25.3

## 2015-06-09 NOTE — Telephone Encounter (Signed)
Is this ok to schedule?-aa

## 2015-06-09 NOTE — Telephone Encounter (Signed)
Pt daughter, Hildred Alamin called to request a meeting with Dr [name] to discuss the changes with her dad.  She has concerns with his personal care, memory and processing skills.  Pt daughter would like to speak with Dr [name] without the patient present.  CB#[phone]/MW

## 2015-06-09 NOTE — Patient Instructions (Signed)
Anticoagulation Dose Instructions as of 06/09/2015      Tyler Kirk Tue Wed Thu Fri Sat   New Dose 2 mg 2 mg 2 mg 2 mg 2 mg 2 mg 2 mg    Description        Continue current dose:  2mg  Daily.

## 2015-07-07 ENCOUNTER — Ambulatory Visit (INDEPENDENT_AMBULATORY_CARE_PROVIDER_SITE_OTHER): Payer: Medicare Other

## 2015-07-07 DIAGNOSIS — I4891 Unspecified atrial fibrillation: Secondary | ICD-10-CM

## 2015-07-07 LAB — POCT INR
INR: 2.6
PT: 31

## 2015-07-07 NOTE — Patient Instructions (Signed)
Continue 2mg  daily and recheck in 4 weeks.

## 2015-07-07 NOTE — Progress Notes (Signed)
Anticoagulation Dose Instructions as of 07/07/2015      Dorene Grebe Tue Wed Thu Fri Sat   New Dose 2 mg 2 mg 2 mg 2 mg 2 mg 2 mg 2 mg    Description        Continue current dose:  2mg  Daily.

## 2015-07-12 ENCOUNTER — Ambulatory Visit (INDEPENDENT_AMBULATORY_CARE_PROVIDER_SITE_OTHER): Payer: Medicare Other | Admitting: Family Medicine

## 2015-07-12 DIAGNOSIS — H9193 Unspecified hearing loss, bilateral: Secondary | ICD-10-CM

## 2015-07-12 DIAGNOSIS — N400 Enlarged prostate without lower urinary tract symptoms: Secondary | ICD-10-CM

## 2015-07-17 ENCOUNTER — Other Ambulatory Visit: Payer: Self-pay | Admitting: Family Medicine

## 2015-07-18 ENCOUNTER — Other Ambulatory Visit: Payer: Self-pay | Admitting: Family Medicine

## 2015-07-18 DIAGNOSIS — E118 Type 2 diabetes mellitus with unspecified complications: Secondary | ICD-10-CM

## 2015-07-18 MED ORDER — GLUCOSE BLOOD VI STRP: 1.0000 | ORAL_STRIP | Status: DC

## 2015-07-18 NOTE — Telephone Encounter (Signed)
Pt contacted office for refill request on the following medications:  ONE TOUCH ULTRA TEST test strip.  Pt test 1 time a day.  [place]  CB#[id number]/MW

## 2015-07-18 NOTE — Telephone Encounter (Signed)
Rx sent to pharmacy. Pt informed.  

## 2015-07-22 NOTE — Progress Notes (Signed)
Met with both daughters today. Daughter, Melina Schools has medical power of attorney. They had several concerns. Due to advanced age and wanted to make sure they have assigned DO NOT RESUSCITATE form at his home. We signed  them today.they also discussed that at times he does not wear his hearing aids nor his alarm bracelet. We discussed just in the distress this to the patient. At 41 he is starting to have some urinary incontinence. We discussed on the next visit we'll get a UA and consider treating with tamsulosin for possible BPH. They're clear that they wish no invasive therapy, no workup for possible prostate cancer. We'll obtain lab work and urinalysis on the next  visit in February.  I have done the exam and reviewed the above chart and it is accurate to the best of my knowledge.

## 2015-08-11 ENCOUNTER — Ambulatory Visit (INDEPENDENT_AMBULATORY_CARE_PROVIDER_SITE_OTHER): Payer: Medicare Other | Admitting: Emergency Medicine

## 2015-08-11 DIAGNOSIS — I4891 Unspecified atrial fibrillation: Secondary | ICD-10-CM | POA: Diagnosis not present

## 2015-08-11 LAB — POCT INR: INR: 2.3

## 2015-08-11 NOTE — Patient Instructions (Signed)
Anticoagulation Dose Instructions as of 08/11/2015      Dorene Grebe Tue Wed Thu Fri Sat   New Dose 2 mg 2 mg 2 mg 2 mg 2 mg 2 mg 2 mg    Description        Continue current dose:  2mg  Daily.

## 2015-08-24 ENCOUNTER — Ambulatory Visit (INDEPENDENT_AMBULATORY_CARE_PROVIDER_SITE_OTHER): Payer: Medicare Other | Admitting: Family Medicine

## 2015-08-24 VITALS — BP 132/78 | HR 128 | Temp 97.7°F | Resp 18 | Ht 66.0 in | Wt 176.0 lb

## 2015-08-24 DIAGNOSIS — Z0000 Encounter for general adult medical examination without abnormal findings: Secondary | ICD-10-CM

## 2015-08-24 DIAGNOSIS — E7800 Pure hypercholesterolemia, unspecified: Secondary | ICD-10-CM | POA: Diagnosis not present

## 2015-08-24 DIAGNOSIS — E118 Type 2 diabetes mellitus with unspecified complications: Secondary | ICD-10-CM | POA: Diagnosis not present

## 2015-08-24 DIAGNOSIS — I10 Essential (primary) hypertension: Secondary | ICD-10-CM

## 2015-08-24 DIAGNOSIS — R000 Tachycardia, unspecified: Secondary | ICD-10-CM

## 2015-08-24 NOTE — Progress Notes (Signed)
Patient ID: Tyler Kirk, male   DOB: Feb 10, 1920, 82 y.o.   MRN: [id number] Patient: Tyler Kirk, Male    DOB: 1920-04-12, 82 y.o.   MRN: [id number] Visit Date: 08/24/2015  Today's Provider: Wilhemena Durie, MD   Chief Complaint  Patient presents with  . Medicare Wellness   Subjective:   Tyler Kirk is a 82 y.o. male who presents today for his Subsequent Annual Wellness Visit. He feels well. He reports exercising daily. He reports he is sleeping well. Daughter is with patient and we had several areas to discuss outside of the wellness exam. He has some little changes in the last few months that he is noticed in the right eye. These are very vague but need evaluation. He has mild chronic urinary incontinence. This is slowly getting worse with a years. It is time for routine follow-up lab work for her hypertension and lipids. He has been on chronic anticoagulation since the 1990s for pulmonary embolus/coagulopathy. Review of Systems  Constitutional: Negative for fever, chills, diaphoresis, activity change, appetite change, fatigue and unexpected weight change.  HENT: Negative for congestion, dental problem, drooling, ear discharge, ear pain, facial swelling, hearing loss, mouth sores, nosebleeds, postnasal drip, rhinorrhea, sinus pressure, sneezing, sore throat, tinnitus, trouble swallowing and voice change.   Eyes: Negative for photophobia, pain, discharge, redness, itching and visual disturbance.  Respiratory: Negative for apnea, cough, choking, chest tightness, shortness of breath, wheezing and stridor.   Cardiovascular: Negative for chest pain, palpitations and leg swelling.  Gastrointestinal: Negative for nausea, vomiting, abdominal pain, diarrhea, constipation, blood in stool, abdominal distention, anal bleeding and rectal pain.  Endocrine: Negative for cold intolerance, heat intolerance, polydipsia, polyphagia and polyuria.  Genitourinary: Positive for frequency. Negative  for dysuria, urgency, hematuria, flank pain, decreased urine volume, discharge, penile swelling, scrotal swelling, enuresis, difficulty urinating, genital sores, penile pain and testicular pain.  Musculoskeletal: Negative for myalgias, back pain, joint swelling, arthralgias, gait problem, neck pain and neck stiffness.  Skin: Negative for color change, pallor, rash and wound.  Allergic/Immunologic: Negative for environmental allergies, food allergies and immunocompromised state.  Neurological: Negative for dizziness, tremors, seizures, syncope, facial asymmetry, speech difficulty, weakness, light-headedness, numbness and headaches.  Hematological: Negative for adenopathy. Does not bruise/bleed easily.  Psychiatric/Behavioral: Negative for suicidal ideas, hallucinations, behavioral problems, confusion, sleep disturbance, self-injury, dysphoric mood, decreased concentration and agitation. The patient is not nervous/anxious and is not hyperactive.     Patient Active Problem List   Diagnosis Date Noted  . Absolute anemia 12/01/2014  . Arthritis 12/01/2014  . A-fib ([place]) 12/01/2014  . Benign fibroma of prostate 12/01/2014  . Cataract 12/01/2014  . Clinical depression 12/01/2014  . Esophagitis, reflux 12/01/2014  . Fatigue 12/01/2014  . Bilateral hearing loss 12/01/2014  . Hyperplasia, prostate 12/01/2014  . Memory loss 12/01/2014  . PE (pulmonary embolism) 12/01/2014  . Basal cell papilloma 12/01/2014  . Dermatologic disease 10/17/2009  . Hypercholesterolemia without hypertriglyceridemia 05/11/2009  . Hernia, inguinal, unilateral 05/09/2006  . BP (high blood pressure) 03/28/2006  . HLD (hyperlipidemia) 03/28/2006  . Diabetes mellitus, type 2 ([place]) 03/28/2006    Social History   Social History  . Marital Status: Widowed    Spouse Name: N/A  . Number of Children: N/A  . Years of Education: N/A   Occupational History  . Not on file.   Social History Main Topics  . Smoking status:  Never Smoker   . Smokeless tobacco: Not on file  . Alcohol Use: No  .  Drug Use: No  . Sexual Activity: Not on file   Other Topics Concern  . Not on file   Social History Narrative    Past Surgical History  Procedure Laterality Date  . Skin cancer: removed  Left     Left arm near wrist area  . Prostate surgery      prostate removed  . Eye surgery      catarct    His family history includes Hypertension in his mother; Stroke in his father and mother.    Outpatient Prescriptions Prior to Visit  Medication Sig Dispense Refill  . fluticasone (FLONASE) 50 MCG/ACT nasal spray Place into the nose.    Marland Kitchen glucose blood (ONE TOUCH ULTRA TEST) test strip 1 each by Other route every morning. Use as instructed Dx:E11.9 100 each 12  . metFORMIN (GLUCOPHAGE) 500 MG tablet Take by mouth.    . MULTIPLE VITAMIN PO Take by mouth.    . simvastatin (ZOCOR) 20 MG tablet Take 1 tablet (20 mg total) by mouth at bedtime. 90 tablet 3  . tamsulosin (FLOMAX) 0.4 MG CAPS capsule Take by mouth.    . triamcinolone ointment (KENALOG) 0.1 % TRIAMCINOLONE ACETONIDE, 0.1% (External Ointment)  small amount apply to affected area as needed for 0 days  Quantity: 61;  Refills: 0   Ordered :06-Oct-2014  Felton Clinton, [place] ;  Started 06-Oct-2014 Active Comments: Medication taken as needed.    . warfarin (COUMADIN) 2 MG tablet Take 1 tablet (2 mg total) by mouth daily. 90 tablet 3  . warfarin (COUMADIN) 3 MG tablet Take 1 tablet (3 mg total) by mouth daily. 30 tablet 12  . warfarin (COUMADIN) 5 MG tablet Take by mouth.     No facility-administered medications prior to visit.    No Known Allergies  Patient Care Team: Jerrol Banana., MD as PCP - General (Family Medicine)  Objective:   Vitals:  Filed Vitals:   08/24/15 1354  BP: 132/78  Pulse: 128  Temp: 97.7 F (36.5 C)  TempSrc: Oral  Resp: 18  Height: 5\' 6"  (1.676 m)  Weight: 176 lb (79.833 kg)    Physical Exam  Constitutional: He is oriented to  person, place, and time. He appears well-developed and well-nourished.  HENT:  Head: Normocephalic and atraumatic.  Right Ear: External ear normal.  Left Ear: External ear normal.  Nose: Nose normal.  Mouth/Throat: Oropharynx is clear and moist.  Eyes: Conjunctivae and EOM are normal. Pupils are equal, round, and reactive to light.  Neck: Normal range of motion. Neck supple.  Cardiovascular: Normal rate, regular rhythm, normal heart sounds and intact distal pulses.   Pulmonary/Chest: Effort normal and breath sounds normal.  Abdominal: Soft. Bowel sounds are normal.  Musculoskeletal: Normal range of motion.  Neurological: He is alert and oriented to person, place, and time.  Skin: Skin is warm and dry.  Psychiatric: He has a normal mood and affect. His behavior is normal. Judgment and thought content normal.    Activities of Daily Living In your present state of health, do you have any difficulty performing the following activities: 08/24/2015 03/16/2015  Hearing? Tempie Donning  Vision? N N  Difficulty concentrating or making decisions? Tempie Donning  Walking or climbing stairs? N N  Dressing or bathing? N N  Doing errands, shopping? N N    Fall Risk Assessment Fall Risk  08/24/2015 08/24/2015 03/16/2015  Falls in the past year? No No Yes  Injury with Fall? - - No  Depression Screen PHQ 2/9 Scores 08/24/2015 03/16/2015  PHQ - 2 Score 0 1    Cognitive Testing - 6-CIT    Year: 0 4 points  Month: 0 3 points  Memorize "Pia Mau, [address], [place]"  Time (within 1 hour:) 0 3 points  Count backwards from 20: 0 2 4 points  Name months of year: 0 2 4 points  Repeat Address: 0 2 4 6 8 10  points   Total Score: 7/28  Interpretation : Normal (0-7) Abnormal (8-28)    Assessment & Plan:     Annual Wellness Visit  Reviewed patient's Family Medical History Reviewed and updated list of patient's medical providers Assessment of cognitive impairment was done Assessed patient's functional  ability Established a written schedule for health screening [place] Completed and Reviewed   Immunization History  Administered Date(s) Administered  . Influenza, High Dose Seasonal PF 03/16/2015  . Pneumococcal Conjugate-13 06/07/2014  . Pneumococcal Polysaccharide-23 03/24/2012  . Td 08/02/2010   1. Medicare annual wellness visit, subsequent   2. Tachycardia In getting vital signs and during routine exam heart rate was between 120-130 inpatient was asymptomatic. On the current time cardiogram was obtained that was normal sinus rhythm needs rate. I think probably the patient was in atrial fibrillation with rapid rate. Now with follow him but may need to start a beta blocker in the near future. - EKG 12-Lead - CBC with Differential/Platelet - TSH  3. Type 2 diabetes mellitus with complication, unspecified long term insulin use status (HCC) - HgB A1c  4. Hypercholesterolemia without hypertriglyceridemia - Comprehensive metabolic panel - Lipid Panel With LDL/HDL Ratio  5. Essential hypertension - TSH    Discussed health benefits of physical activity, and encouraged him to engage in regular exercise appropriate for his age and condition.    Miguel Aschoff MD [facility] 08/24/2015 1:58 PM  ------------------------------------------------------------------------------------------------------------

## 2015-08-25 LAB — CBC WITH DIFFERENTIAL/PLATELET
BASOS ABS: 0.1 10*3/uL (ref 0.0–0.2)
Basos: 1 %
EOS (ABSOLUTE): 0.5 10*3/uL — AB (ref 0.0–0.4)
EOS: 7 %
HEMATOCRIT: 45.2 % (ref 37.5–51.0)
Hemoglobin: 15 g/dL (ref 12.6–17.7)
IMMATURE GRANULOCYTES: 0 %
Immature Grans (Abs): 0 10*3/uL (ref 0.0–0.1)
Lymphocytes Absolute: 1.8 10*3/uL (ref 0.7–3.1)
Lymphs: 27 %
MCH: 29.1 pg (ref 26.6–33.0)
MCHC: 33.2 g/dL (ref 31.5–35.7)
MCV: 88 fL (ref 79–97)
MONOS ABS: 0.8 10*3/uL (ref 0.1–0.9)
Monocytes: 12 %
NEUTROS PCT: 53 %
Neutrophils Absolute: 3.5 10*3/uL (ref 1.4–7.0)
PLATELETS: 242 10*3/uL (ref 150–379)
RBC: 5.16 x10E6/uL (ref 4.14–5.80)
RDW: 13.5 % (ref 12.3–15.4)
WBC: 6.7 10*3/uL (ref 3.4–10.8)

## 2015-08-25 LAB — LIPID PANEL WITH LDL/HDL RATIO
CHOLESTEROL TOTAL: 146 mg/dL (ref 100–199)
HDL: 51 mg/dL (ref >39)
LDL Calculated: 48 mg/dL (ref 0–99)
LDl/HDL Ratio: 0.9 ratio units (ref 0.0–3.6)
Triglycerides: 234 mg/dL — ABNORMAL HIGH (ref 0–149)
VLDL CHOLESTEROL CAL: 47 mg/dL — AB (ref 5–40)

## 2015-08-25 LAB — COMPREHENSIVE METABOLIC PANEL
ALK PHOS: 49 IU/L (ref 39–117)
ALT: 27 IU/L (ref 0–44)
AST: 28 IU/L (ref 0–40)
Albumin/Globulin Ratio: 1.6 (ref 1.1–2.5)
Albumin: 4 g/dL (ref 3.2–4.6)
BUN/Creatinine Ratio: 22 (ref 10–22)
BUN: 20 mg/dL (ref 10–36)
Bilirubin Total: 0.3 mg/dL (ref 0.0–1.2)
CALCIUM: 9.8 mg/dL (ref 8.6–10.2)
CO2: 24 mmol/L (ref 18–29)
CREATININE: 0.91 mg/dL (ref 0.76–1.27)
Chloride: 103 mmol/L (ref 96–106)
GFR calc Af Amer: 84 mL/min/{1.73_m2} (ref >59)
GFR, EST NON AFRICAN AMERICAN: 72 mL/min/{1.73_m2} (ref >59)
GLUCOSE: 153 mg/dL — AB (ref 65–99)
Globulin, Total: 2.5 g/dL (ref 1.5–4.5)
Potassium: 5.1 mmol/L (ref 3.5–5.2)
Sodium: 142 mmol/L (ref 134–144)
Total Protein: 6.5 g/dL (ref 6.0–8.5)

## 2015-08-25 LAB — TSH: TSH: 3.65 u[IU]/mL (ref 0.450–4.500)

## 2015-08-25 LAB — HEMOGLOBIN A1C
ESTIMATED AVERAGE GLUCOSE: 177 mg/dL
HEMOGLOBIN A1C: 7.8 % — AB (ref 4.8–5.6)

## 2015-08-28 ENCOUNTER — Telehealth: Payer: Self-pay | Admitting: Family Medicine

## 2015-08-28 NOTE — Telephone Encounter (Signed)
Pt called looking for lab results from last week.  His call back is [phone]  [facility]

## 2015-08-28 NOTE — Telephone Encounter (Signed)
Pt informed of lab results and voiced understanding of results.

## 2015-09-08 ENCOUNTER — Ambulatory Visit (INDEPENDENT_AMBULATORY_CARE_PROVIDER_SITE_OTHER): Payer: Medicare Other

## 2015-09-08 DIAGNOSIS — I4891 Unspecified atrial fibrillation: Secondary | ICD-10-CM

## 2015-09-08 LAB — POCT INR
INR: 2.3
PT: 27.5

## 2015-09-08 NOTE — Patient Instructions (Signed)
Anticoagulation Dose Instructions as of 09/08/2015      Tyler Kirk Tue Wed Thu Fri Sat   New Dose 2 mg 2 mg 2 mg 2 mg 2 mg 2 mg 2 mg    Description        Continue current dose:  2mg  Daily.

## 2015-10-06 ENCOUNTER — Ambulatory Visit (INDEPENDENT_AMBULATORY_CARE_PROVIDER_SITE_OTHER): Payer: Medicare Other

## 2015-10-06 DIAGNOSIS — I4891 Unspecified atrial fibrillation: Secondary | ICD-10-CM | POA: Diagnosis not present

## 2015-10-06 LAB — POCT INR
INR: 2.3
PT: 28

## 2015-10-06 NOTE — Patient Instructions (Signed)
Anticoagulation Dose Instructions as of 10/06/2015      Tyler Kirk Tue Wed Thu Fri Sat   New Dose 2 mg 2 mg 2 mg 2 mg 2 mg 2 mg 2 mg    Description        2 mg daily F/U 4 weeks

## 2015-10-25 ENCOUNTER — Other Ambulatory Visit: Payer: Self-pay | Admitting: Family Medicine

## 2015-10-25 MED ORDER — TAMSULOSIN HCL 0.4 MG PO CAPS: 0.4000 mg | ORAL_CAPSULE | Freq: Every day | ORAL | Status: DC

## 2015-10-25 MED ORDER — METFORMIN HCL 500 MG PO TABS: 500.0000 mg | ORAL_TABLET | Freq: Every day | ORAL | Status: DC

## 2015-10-25 NOTE — Telephone Encounter (Signed)
Pt contacted office for refill request on the following medications: 1. tamsulosin (FLOMAX) 0.4 MG CAPS capsule   2. metFORMIN (GLUCOPHAGE) 500 MG tablet To [place]. Thanks TNP

## 2015-10-25 NOTE — Telephone Encounter (Signed)
RXs refilled-aa 

## 2015-11-03 ENCOUNTER — Ambulatory Visit (INDEPENDENT_AMBULATORY_CARE_PROVIDER_SITE_OTHER): Payer: Medicare Other

## 2015-11-03 DIAGNOSIS — I4891 Unspecified atrial fibrillation: Secondary | ICD-10-CM | POA: Diagnosis not present

## 2015-11-03 LAB — POCT INR
INR: 2.4
PT: 28.4

## 2015-11-03 NOTE — Patient Instructions (Signed)
Anticoagulation Dose Instructions as of 11/03/2015      Tyler Kirk Tue Wed Thu Fri Sat   New Dose 2 mg 2 mg 2 mg 2 mg 2 mg 2 mg 2 mg    Description        2 mg daily F/U 4 weeks

## 2015-11-16 ENCOUNTER — Other Ambulatory Visit: Payer: Self-pay | Admitting: Family Medicine

## 2015-11-16 DIAGNOSIS — E118 Type 2 diabetes mellitus with unspecified complications: Secondary | ICD-10-CM

## 2015-11-16 MED ORDER — GLUCOSE BLOOD VI STRP: 1.0000 | ORAL_STRIP | Status: DC

## 2015-11-16 NOTE — Telephone Encounter (Signed)
Okay to order? Analynn Daum Drozdowski, CMA  

## 2015-11-16 NOTE — Telephone Encounter (Signed)
Pt contacted office for refill request on the following medications:  glucose blood (ONE TOUCH ULTRA TEST) test strip.  Pt test 1 time a day.  [name]  CB#[phone]/MW  This is a pt of Dr [name].

## 2015-12-01 ENCOUNTER — Ambulatory Visit (INDEPENDENT_AMBULATORY_CARE_PROVIDER_SITE_OTHER): Payer: Medicare Other

## 2015-12-01 DIAGNOSIS — I4891 Unspecified atrial fibrillation: Secondary | ICD-10-CM | POA: Diagnosis not present

## 2015-12-01 LAB — POCT INR
INR: 2
PT: 24.4

## 2015-12-01 NOTE — Patient Instructions (Signed)
Anticoagulation Dose Instructions as of 12/01/2015      Dorene Grebe Tue Wed Thu Fri Sat   New Dose 2 mg 2 mg 2 mg 2 mg 2 mg 2 mg 2 mg    Description        2 mg daily F/U 4 weeks

## 2015-12-21 ENCOUNTER — Ambulatory Visit: Payer: Medicare Other | Admitting: Family Medicine

## 2015-12-28 ENCOUNTER — Ambulatory Visit (INDEPENDENT_AMBULATORY_CARE_PROVIDER_SITE_OTHER): Payer: Medicare Other | Admitting: Family Medicine

## 2015-12-28 ENCOUNTER — Encounter: Payer: Self-pay | Admitting: Family Medicine

## 2015-12-28 VITALS — BP 128/66 | HR 78 | Temp 98.5°F | Resp 16 | Wt 174.0 lb

## 2015-12-28 DIAGNOSIS — J3089 Other allergic rhinitis: Secondary | ICD-10-CM | POA: Diagnosis not present

## 2015-12-28 DIAGNOSIS — E118 Type 2 diabetes mellitus with unspecified complications: Secondary | ICD-10-CM

## 2015-12-28 DIAGNOSIS — N400 Enlarged prostate without lower urinary tract symptoms: Secondary | ICD-10-CM | POA: Diagnosis not present

## 2015-12-28 DIAGNOSIS — I10 Essential (primary) hypertension: Secondary | ICD-10-CM | POA: Diagnosis not present

## 2015-12-28 DIAGNOSIS — I4891 Unspecified atrial fibrillation: Secondary | ICD-10-CM

## 2015-12-28 DIAGNOSIS — R413 Other amnesia: Secondary | ICD-10-CM

## 2015-12-28 LAB — POCT INR
INR: 2.4
PT: 28.6

## 2015-12-28 LAB — POCT GLYCOSYLATED HEMOGLOBIN (HGB A1C): HEMOGLOBIN A1C: 7.3

## 2015-12-28 NOTE — Progress Notes (Signed)
Subjective:  HPI  Patient is here for 4 months follow up. Diabetes: patient checks his sugar at home and readings are usually fasting about 125-130. He is taking Metformin. Lab Results  Component Value Date   HGBA1C 7.3 12/28/2015   Coumadin management: Last INR was 2.0 on 12/01/15. He takes Coumadin 2 mg daily. Today INR is 2.4  Hyperlipidemia:  Lab Results  Component Value Date   CHOL 146 08/24/2015   HDL 51 08/24/2015   LDLCALC 48 08/24/2015   TRIG 234* 08/24/2015  He complains of chronic BPH symptoms, mainly nocturia 3-4. We discussed these symptoms and treatment options at some length.   Prior to Admission medications   Medication Sig Start Date End Date Taking? Authorizing Provider  fluticasone (FLONASE) 50 MCG/ACT nasal spray Place into the nose. 06/07/14   Historical Provider, MD  glucose blood (ONE TOUCH ULTRA TEST) test strip 1 each by Other route every morning. Use as instructed Dx:E11.9 11/16/15   Margarita Rana, MD  metFORMIN (GLUCOPHAGE) 500 MG tablet Take 1 tablet (500 mg total) by mouth daily. 10/25/15   Richard Maceo Pro., MD  MULTIPLE VITAMIN PO Take by mouth. 08/01/10   Historical Provider, MD  simvastatin (ZOCOR) 20 MG tablet Take 1 tablet (20 mg total) by mouth at bedtime. 03/21/15   Richard Maceo Pro., MD  tamsulosin (FLOMAX) 0.4 MG CAPS capsule Take 1 capsule (0.4 mg total) by mouth daily. 10/25/15   Richard Maceo Pro., MD  triamcinolone ointment (KENALOG) 0.1 % TRIAMCINOLONE ACETONIDE, 0.1% (External Ointment)  small amount apply to affected area as needed for 0 days  Quantity: 61;  Refills: 0   Ordered :06-Oct-2014  Felton Clinton, [place] ;  Started 06-Oct-2014 Active Comments: Medication taken as needed. 10/06/14   Historical Provider, MD  warfarin (COUMADIN) 2 MG tablet Take 1 tablet (2 mg total) by mouth daily. 04/14/15   Richard Maceo Pro., MD  warfarin (COUMADIN) 3 MG tablet Take 1 tablet (3 mg total) by mouth daily. 12/06/14   Jerrol Banana., MD    warfarin (COUMADIN) 5 MG tablet Take by mouth. 08/23/11   Historical Provider, MD    Patient Active Problem List   Diagnosis Date Noted  . Absolute anemia 12/01/2014  . Arthritis 12/01/2014  . A-fib ([place]) 12/01/2014  . Benign fibroma of prostate 12/01/2014  . Cataract 12/01/2014  . Clinical depression 12/01/2014  . Esophagitis, reflux 12/01/2014  . Fatigue 12/01/2014  . Bilateral hearing loss 12/01/2014  . Hyperplasia, prostate 12/01/2014  . Memory loss 12/01/2014  . PE (pulmonary embolism) 12/01/2014  . Basal cell papilloma 12/01/2014  . Dermatologic disease 10/17/2009  . Hypercholesterolemia without hypertriglyceridemia 05/11/2009  . Hernia, inguinal, unilateral 05/09/2006  . BP (high blood pressure) 03/28/2006  . HLD (hyperlipidemia) 03/28/2006  . Diabetes mellitus, type 2 ([place]) 03/28/2006    Past Medical History  Diagnosis Date  . Depression   . Diabetes mellitus without complication ([place])   . Hypertension   . Hyperlipidemia   . Pulmonary embolism ([place])   . Afib ([place])   . GERD (gastroesophageal reflux disease)   . Hernia, inguinal     Chronic Large Inguinal Hernia  . Arthritis   . BPH (benign prostatic hypertrophy)     Social History   Social History  . Marital Status: Widowed    Spouse Name: N/A  . Number of Children: N/A  . Years of Education: N/A   Occupational History  . Not on file.  Social History Main Topics  . Smoking status: Never Smoker   . Smokeless tobacco: Not on file  . Alcohol Use: No  . Drug Use: No  . Sexual Activity: Not on file   Other Topics Concern  . Not on file   Social History Narrative    No Known Allergies  Review of Systems  Constitutional: Negative.   HENT: Positive for congestion.   Eyes: Negative.   Respiratory: Negative.   Cardiovascular: Negative.   Gastrointestinal: Negative.   Genitourinary: Positive for frequency (nocturia).  Skin: Positive for itching.  Neurological: Negative.   Endo/Heme/Allergies:  Negative.   Psychiatric/Behavioral: Positive for memory loss.    Immunization History  Administered Date(s) Administered  . Influenza, High Dose Seasonal PF 03/16/2015  . Pneumococcal Conjugate-13 06/07/2014  . Pneumococcal Polysaccharide-23 03/24/2012  . Td 08/02/2010   Objective:  BP 128/66 mmHg  Pulse 78  Temp(Src) 98.5 F (36.9 C)  Resp 16  Wt 174 lb (78.926 kg)  Physical Exam  Constitutional: He is oriented to person, place, and time and well-developed, well-nourished, and in no distress.  HENT:  Head: Normocephalic and atraumatic.  Right Ear: External ear normal.  Left Ear: External ear normal.  Nose: Nose normal.  Eyes: Conjunctivae are normal. Pupils are equal, round, and reactive to light.  Neck: Normal range of motion. Neck supple.  Cardiovascular: Normal rate, regular rhythm, normal heart sounds and intact distal pulses.   No murmur heard. Pulmonary/Chest: Effort normal and breath sounds normal. No respiratory distress. He has no wheezes.  Abdominal: Soft.  Musculoskeletal: He exhibits edema (trace). He exhibits no tenderness.  Neurological: He is alert and oriented to person, place, and time.  Skin: Skin is warm and dry.  Psychiatric: Mood and affect normal.   Diabetic Foot Exam - Simple   Simple Foot Form  Diabetic Foot exam was performed with the following findings:  Yes 12/28/2015  2:02 PM  Visual Inspection  No deformities, no ulcerations, no other skin breakdown bilaterally:  Yes  Sensation Testing  Intact to touch and monofilament testing bilaterally:  Yes  Pulse Check  Posterior Tibialis and Dorsalis pulse intact bilaterally:  Yes  Comments       Lab Results  Component Value Date   WBC 6.7 08/24/2015   HGB 15.4 10/10/2014   HCT 45.2 08/24/2015   PLT 242 08/24/2015   GLUCOSE 153* 08/24/2015   CHOL 146 08/24/2015   TRIG 234* 08/24/2015   HDL 51 08/24/2015   LDLCALC 48 08/24/2015   TSH 3.650 08/24/2015   INR 2.4 12/28/2015   HGBA1C 7.3  12/28/2015    CMP     Component Value Date/Time   NA 142 08/24/2015 1532   NA 140 10/13/2013 0814   K 5.1 08/24/2015 1532   K 4.6 10/13/2013 0814   CL 103 08/24/2015 1532   CL 105 10/13/2013 0814   CO2 24 08/24/2015 1532   CO2 31 10/13/2013 0814   GLUCOSE 153* 08/24/2015 1532   GLUCOSE 152* 10/13/2013 0814   BUN 20 08/24/2015 1532   BUN 23* 10/13/2013 0814   CREATININE 0.91 08/24/2015 1532   CREATININE 0.9 10/10/2014   CREATININE 1.00 10/13/2013 0814   CALCIUM 9.8 08/24/2015 1532   CALCIUM 9.1 10/13/2013 0814   PROT 6.5 08/24/2015 1532   PROT 6.9 10/13/2013 0814   ALBUMIN 4.0 08/24/2015 1532   ALBUMIN 3.4 10/13/2013 0814   AST 28 08/24/2015 1532   AST 24 10/13/2013 0814   ALT 27 08/24/2015 1532  ALT 28 10/13/2013 0814   ALKPHOS 49 08/24/2015 1532   ALKPHOS 52 10/13/2013 0814   BILITOT 0.3 08/24/2015 1532   BILITOT 0.5 10/13/2013 0814   GFRNONAA 72 08/24/2015 1532   GFRNONAA >60 10/13/2013 0814   GFRAA 84 08/24/2015 1532   GFRAA >60 10/13/2013 0814    Assessment and Plan :  1. Type 2 diabetes mellitus with complication, unspecified long term insulin use status (HCC) A1C today 7.4, better. Continue current medication. - POCT HgB A1C  2. Atrial fibrillation, unspecified INR 2.4 today, continue 2 mg daily andre check in 1 month - POCT INR  3. Essential hypertension Stable.  4. Memory loss/MCI  5. AR Advised patient to use Flonase spray the best time after the shower.  6. BPH Patient already on Flomax, may need to consider Rapaflo and could be a sleep apnea could be a component. Follow for now. More than 50% of visit is spent in counseling regarding these multiple issues. 7. Senile purpura 8.PE  Occurred in 1998. F long anticoagulation was decided. 9. Chronic allergic rhinitis symptoms  Patient was seen and examined by Dr. Eulas Post and note was scribed by Theressa Millard, RMA.    Miguel Aschoff MD [facility] 12/28/2015 2:11 PM

## 2015-12-29 ENCOUNTER — Ambulatory Visit: Payer: Self-pay

## 2016-01-26 ENCOUNTER — Other Ambulatory Visit: Payer: Self-pay

## 2016-01-26 ENCOUNTER — Ambulatory Visit (INDEPENDENT_AMBULATORY_CARE_PROVIDER_SITE_OTHER): Payer: Medicare Other

## 2016-01-26 DIAGNOSIS — I482 Chronic atrial fibrillation, unspecified: Secondary | ICD-10-CM

## 2016-01-26 LAB — POCT INR
INR: 2.5
PT: 30.5

## 2016-01-26 NOTE — Patient Instructions (Signed)
Anticoagulation Dose Instructions as of 01/26/2016      Tyler Kirk Tue Wed Thu Fri Sat   New Dose 2 mg 2 mg 2 mg 2 mg 2 mg 2 mg 2 mg    Description   2 mg daily F/U 4 weeks

## 2016-01-29 ENCOUNTER — Ambulatory Visit (INDEPENDENT_AMBULATORY_CARE_PROVIDER_SITE_OTHER): Payer: Medicare Other | Admitting: Family Medicine

## 2016-01-29 ENCOUNTER — Telehealth: Payer: Self-pay

## 2016-01-29 ENCOUNTER — Encounter: Payer: Self-pay | Admitting: Family Medicine

## 2016-01-29 VITALS — BP 148/62 | HR 62 | Temp 98.4°F | Resp 16 | Wt 174.0 lb

## 2016-01-29 DIAGNOSIS — I482 Chronic atrial fibrillation, unspecified: Secondary | ICD-10-CM

## 2016-01-29 DIAGNOSIS — R42 Dizziness and giddiness: Secondary | ICD-10-CM | POA: Diagnosis not present

## 2016-01-29 DIAGNOSIS — E118 Type 2 diabetes mellitus with unspecified complications: Secondary | ICD-10-CM | POA: Diagnosis not present

## 2016-01-29 DIAGNOSIS — R2681 Unsteadiness on feet: Secondary | ICD-10-CM | POA: Diagnosis not present

## 2016-01-29 LAB — POCT URINALYSIS DIPSTICK
Bilirubin, UA: NEGATIVE
Glucose, UA: NEGATIVE
KETONES UA: NEGATIVE
LEUKOCYTES UA: NEGATIVE
Nitrite, UA: NEGATIVE
PH UA: 6.5
PROTEIN UA: NEGATIVE
Spec Grav, UA: 1.015
Urobilinogen, UA: 0.2

## 2016-01-29 LAB — GLUCOSE, POCT (MANUAL RESULT ENTRY): POC GLUCOSE: 175 mg/dL — AB (ref 70–99)

## 2016-01-29 MED ORDER — ASPIRIN EC 81 MG PO TBEC: DELAYED_RELEASE_TABLET | ORAL | Status: DC

## 2016-01-29 NOTE — Progress Notes (Signed)
Subjective:  HPI  Patient noticed this morning that he is more shaky and wobbly today than usual, some weakness present and lightheadedness. He checked his sugar this morning fasting was 150 and after he ate breaskfast it went up to 240. Right now sugar is 175. No urinary issues no more than usual, no chest pain or tightness. He was not able to get his regular walk due to feeling unsteady. When he is resting and sitting down he feels fine.  After interviewing patient and exam it is found that there is no air conditioning in the house despite outside temperature in the upper 90s. Inside temperature generally reads between 80 and 88 in the summer. He does not drink much water during the day. All of his chronic medical issues are currently stable. He still lives alone. His daughters check on him regularly. Prior to Admission medications   Medication Sig Start Date End Date Taking? Authorizing Provider  fluticasone (FLONASE) 50 MCG/ACT nasal spray Place into the nose. 06/07/14  Yes Historical Provider, MD  glucose blood (ONE TOUCH ULTRA TEST) test strip 1 each by Other route every morning. Use as instructed Dx:E11.9 11/16/15  Yes Margarita Rana, MD  metFORMIN (GLUCOPHAGE) 500 MG tablet Take 1 tablet (500 mg total) by mouth daily. 10/25/15  Yes Eevie Lapp Maceo Pro., MD  MULTIPLE VITAMIN PO Take by mouth. 08/01/10  Yes Historical Provider, MD  simvastatin (ZOCOR) 20 MG tablet Take 1 tablet (20 mg total) by mouth at bedtime. 03/21/15  Yes Bronson Bressman Maceo Pro., MD  tamsulosin (FLOMAX) 0.4 MG CAPS capsule Take 1 capsule (0.4 mg total) by mouth daily. 10/25/15  Yes Wood Novacek Maceo Pro., MD  triamcinolone ointment (KENALOG) 0.1 % TRIAMCINOLONE ACETONIDE, 0.1% (External Ointment)  small amount apply to affected area as needed for 0 days  Quantity: 61;  Refills: 0   Ordered :06-Oct-2014  Felton Clinton, [place] ;  Started 06-Oct-2014 Active Comments: Medication taken as needed. 10/06/14  Yes Historical Provider, MD  warfarin  (COUMADIN) 2 MG tablet Take 1 tablet (2 mg total) by mouth daily. 04/14/15  Yes Rashi Giuliani Maceo Pro., MD  warfarin (COUMADIN) 3 MG tablet Take 1 tablet (3 mg total) by mouth daily. 12/06/14  Yes Takeyah Wieman Maceo Pro., MD  warfarin (COUMADIN) 5 MG tablet Take by mouth. 08/23/11  Yes Historical Provider, MD    Patient Active Problem List   Diagnosis Date Noted  . Absolute anemia 12/01/2014  . Arthritis 12/01/2014  . A-fib ([place]) 12/01/2014  . Benign fibroma of prostate 12/01/2014  . Cataract 12/01/2014  . Clinical depression 12/01/2014  . Esophagitis, reflux 12/01/2014  . Fatigue 12/01/2014  . Bilateral hearing loss 12/01/2014  . Hyperplasia, prostate 12/01/2014  . Memory loss 12/01/2014  . PE (pulmonary embolism) 12/01/2014  . Basal cell papilloma 12/01/2014  . Dermatologic disease 10/17/2009  . Hypercholesterolemia without hypertriglyceridemia 05/11/2009  . Hernia, inguinal, unilateral 05/09/2006  . BP (high blood pressure) 03/28/2006  . HLD (hyperlipidemia) 03/28/2006  . Diabetes mellitus, type 2 ([place]) 03/28/2006    Past Medical History:  Diagnosis Date  . Afib ([place])   . Arthritis   . BPH (benign prostatic hypertrophy)   . Depression   . Diabetes mellitus without complication ([place])   . GERD (gastroesophageal reflux disease)   . Hernia, inguinal    Chronic Large Inguinal Hernia  . Hyperlipidemia   . Hypertension   . Pulmonary embolism [facility])     Social History   Social History  .  Marital status: Widowed    Spouse name: N/A  . Number of children: N/A  . Years of education: N/A   Occupational History  . Not on file.   Social History Main Topics  . Smoking status: Never Smoker  . Smokeless tobacco: Not on file  . Alcohol use No  . Drug use: No  . Sexual activity: Not on file   Other Topics Concern  . Not on file   Social History Narrative  . No narrative on file    No Known Allergies  Review of Systems  Constitutional: Positive for malaise/fatigue.    HENT: Positive for congestion.        Post nasal drainage  Eyes: Negative.   Respiratory: Positive for cough.   Cardiovascular: Negative.   Gastrointestinal: Negative.   Genitourinary: Positive for frequency and urgency.  Musculoskeletal: Positive for joint pain.  Neurological: Positive for dizziness, tremors and weakness.  Endo/Heme/Allergies: Negative.   Psychiatric/Behavioral: Positive for memory loss.    Immunization History  Administered Date(s) Administered  . Influenza, High Dose Seasonal PF 03/16/2015  . Pneumococcal Conjugate-13 06/07/2014  . Pneumococcal Polysaccharide-23 03/24/2012  . Td 08/02/2010   Objective:  BP (!) 148/62   Pulse 62   Temp 98.4 F (36.9 C)   Resp 16   Wt 174 lb (78.9 kg)   SpO2 97%   BMI 28.08 kg/m  Orthostatic Blood Pressure: Blood pressure:   lying 164/72, sitting 152/66, standing 148/68 Pulse:   lying 62, sitting 60, standing 64  Physical Exam  Constitutional: He is oriented to person, place, and time and well-developed, well-nourished, and in no distress.  HENT:  Head: Normocephalic and atraumatic.  Right Ear: External ear normal.  Left Ear: External ear normal.  Nose: Nose normal.  Mouth/Throat: Oropharynx is clear and moist.  Eyes: Conjunctivae are normal. Pupils are equal, round, and reactive to light.  Neck: Normal range of motion. Neck supple.  Cardiovascular: Normal rate, regular rhythm, normal heart sounds and intact distal pulses.   No murmur heard. Pulmonary/Chest: Effort normal and breath sounds normal. No respiratory distress. He has no wheezes.  Abdominal: He exhibits no distension. There is no tenderness. There is no rebound.  Neurological: He is alert and oriented to person, place, and time. No cranial nerve deficit.  Skin: Skin is warm and dry.  Psychiatric: Affect and judgment normal.  Neurologic exam grossly nonfocal.  Lab Results  Component Value Date   WBC 7.6 01/29/2016   HGB 15.4 10/10/2014   HCT  45.4 01/29/2016   PLT 225 01/29/2016   GLUCOSE 119 (H) 01/29/2016   CHOL 146 08/24/2015   TRIG 234 (H) 08/24/2015   HDL 51 08/24/2015   LDLCALC 48 08/24/2015   TSH 3.410 01/29/2016   INR 2.5 01/26/2016   HGBA1C 7.3 12/28/2015    CMP     Component Value Date/Time   NA 143 01/29/2016 1228   NA 140 10/13/2013 0814   K 4.9 01/29/2016 1228   K 4.6 10/13/2013 0814   CL 102 01/29/2016 1228   CL 105 10/13/2013 0814   CO2 25 01/29/2016 1228   CO2 31 10/13/2013 0814   GLUCOSE 119 (H) 01/29/2016 1228   GLUCOSE 152 (H) 10/13/2013 0814   BUN 22 01/29/2016 1228   BUN 23 (H) 10/13/2013 0814   CREATININE 0.88 01/29/2016 1228   CREATININE 1.00 10/13/2013 0814   CALCIUM 9.8 01/29/2016 1228   CALCIUM 9.1 10/13/2013 0814   PROT 6.5 08/24/2015 1532   PROT  6.9 10/13/2013 0814   ALBUMIN 4.1 01/29/2016 1228   ALBUMIN 3.4 10/13/2013 0814   AST 28 08/24/2015 1532   AST 24 10/13/2013 0814   ALT 27 08/24/2015 1532   ALT 28 10/13/2013 0814   ALKPHOS 49 08/24/2015 1532   ALKPHOS 52 10/13/2013 0814   BILITOT 0.3 08/24/2015 1532   BILITOT 0.5 10/13/2013 0814   GFRNONAA 74 01/29/2016 1228   GFRNONAA >60 10/13/2013 0814   GFRAA 86 01/29/2016 1228   GFRAA >60 10/13/2013 0814    Assessment and Plan :  1. Dizziness Exam is stable today. Patient does not seem in distress right now. EKG stable. Orthostatics done in the office and there were a little of bit of a drop with b/p readings. Patient may have had a small TIA. Will get  [place] to check carotid. Advised patient to take Aspirin 81 mg on M/W/F.Lengthy discussion had with the patient and his daughter, [name] Push fluids. - POCT Glucose (CBG)-175 - POCT urinalysis dipstick  2. Type 2 diabetes mellitus with complication, unspecified long term insulin use status (HCC) - POCT urinalysis dipstick  3. Chronic atrial fibrillation (HCC) 4. Pulmonary embolus, unprovoked It was determined then patient should be on lifelong anticoagulation. More than 50%  of this visit was spent in counseling regarding his chronic medical issues. 5. Hyperlipidemia 6. Hypertension Patient was seen and examined by Dr. Eulas Post and note was scribed by Theressa Millard, RMA.    Miguel Aschoff MD [facility] 01/30/2016 9:40 AM

## 2016-01-29 NOTE — Telephone Encounter (Signed)
Daughter advised, it will take a few minutes before he can come she has to go get him and get him ready by 11 am at the latest they will be here-aa

## 2016-01-29 NOTE — Telephone Encounter (Signed)
Patient's daughter is calling saying that the patient woke up this morning very "shakey" and off balance. She reports that the patient denies any headaches, nausea, dizziness, or shortness of breath. She reports that the patient told her that his blood sugar was in "normal range". She is wondering if the patient needs to be seen. She reports that he was unable to do his morning walk due to the shakiness, which is very unusual for the patient. Any recommendations? Please advise. Thanks!

## 2016-01-29 NOTE — Telephone Encounter (Signed)
If he wants  to be seen and have him come in now

## 2016-01-30 ENCOUNTER — Ambulatory Visit: Payer: Medicare Other | Admitting: Family Medicine

## 2016-01-30 LAB — CBC WITH DIFFERENTIAL/PLATELET
BASOS ABS: 0.1 10*3/uL (ref 0.0–0.2)
Basos: 1 %
EOS (ABSOLUTE): 0.5 10*3/uL — AB (ref 0.0–0.4)
Eos: 6 %
Hematocrit: 45.4 % (ref 37.5–51.0)
Hemoglobin: 15.2 g/dL (ref 12.6–17.7)
Immature Grans (Abs): 0 10*3/uL (ref 0.0–0.1)
Immature Granulocytes: 0 %
LYMPHS ABS: 2 10*3/uL (ref 0.7–3.1)
LYMPHS: 27 %
MCH: 29.4 pg (ref 26.6–33.0)
MCHC: 33.5 g/dL (ref 31.5–35.7)
MCV: 88 fL (ref 79–97)
MONOS ABS: 1 10*3/uL — AB (ref 0.1–0.9)
Monocytes: 13 %
NEUTROS ABS: 4 10*3/uL (ref 1.4–7.0)
Neutrophils: 53 %
PLATELETS: 225 10*3/uL (ref 150–379)
RBC: 5.17 x10E6/uL (ref 4.14–5.80)
RDW: 13.9 % (ref 12.3–15.4)
WBC: 7.6 10*3/uL (ref 3.4–10.8)

## 2016-01-30 LAB — RENAL FUNCTION PANEL
Albumin: 4.1 g/dL (ref 3.2–4.6)
BUN/Creatinine Ratio: 25 — ABNORMAL HIGH (ref 10–24)
BUN: 22 mg/dL (ref 10–36)
CO2: 25 mmol/L (ref 18–29)
Calcium: 9.8 mg/dL (ref 8.6–10.2)
Chloride: 102 mmol/L (ref 96–106)
Creatinine, Ser: 0.88 mg/dL (ref 0.76–1.27)
GFR, EST AFRICAN AMERICAN: 86 mL/min/{1.73_m2} (ref >59)
GFR, EST NON AFRICAN AMERICAN: 74 mL/min/{1.73_m2} (ref >59)
GLUCOSE: 119 mg/dL — AB (ref 65–99)
POTASSIUM: 4.9 mmol/L (ref 3.5–5.2)
Phosphorus: 3.5 mg/dL (ref 2.5–4.5)
SODIUM: 143 mmol/L (ref 134–144)

## 2016-01-30 LAB — TSH: TSH: 3.41 u[IU]/mL (ref 0.450–4.500)

## 2016-02-21 ENCOUNTER — Ambulatory Visit
Admission: RE | Admit: 2016-02-21 | Discharge: 2016-02-21 | Disposition: A | Discharge status: Home / Self Care | Payer: Medicare Other | Source: Ambulatory Visit | Attending: Family Medicine | Admitting: Family Medicine

## 2016-02-21 DIAGNOSIS — I6523 Occlusion and stenosis of bilateral carotid arteries: Secondary | ICD-10-CM | POA: Insufficient documentation

## 2016-02-21 DIAGNOSIS — R42 Dizziness and giddiness: Secondary | ICD-10-CM | POA: Diagnosis present

## 2016-02-21 DIAGNOSIS — R2681 Unsteadiness on feet: Secondary | ICD-10-CM | POA: Insufficient documentation

## 2016-02-23 ENCOUNTER — Ambulatory Visit (INDEPENDENT_AMBULATORY_CARE_PROVIDER_SITE_OTHER): Payer: Medicare Other

## 2016-02-23 DIAGNOSIS — I482 Chronic atrial fibrillation, unspecified: Secondary | ICD-10-CM

## 2016-02-23 LAB — POCT INR
INR: 2.3
PT: 27.1

## 2016-02-23 NOTE — Patient Instructions (Signed)
Anticoagulation Dose Instructions as of 02/23/2016      Tyler Kirk Tue Wed Thu Fri Sat   New Dose 2 mg 2 mg 2 mg 2 mg 2 mg 2 mg 2 mg    Description   2 mg daily F/U 4 weeks

## 2016-03-11 ENCOUNTER — Other Ambulatory Visit: Payer: Self-pay | Admitting: Family Medicine

## 2016-03-11 DIAGNOSIS — E7800 Pure hypercholesterolemia, unspecified: Secondary | ICD-10-CM

## 2016-03-11 MED ORDER — SIMVASTATIN 20 MG PO TABS: 20.0000 mg | ORAL_TABLET | Freq: Every day | ORAL | 3 refills | Status: DC

## 2016-03-11 NOTE — Telephone Encounter (Signed)
done-aa

## 2016-03-11 NOTE — Telephone Encounter (Signed)
Pt contacted office for refill request on the following medications: simvastatin (ZOCOR) 20 MG tablet for 90 day supply. To [facility]  Last written: 03/21/15 Last OV: 01/29/16 Pt stated he has to pick up another RX today and would like this sent in asap so that he can pick up both medications today. Please advise. Thanks TNP

## 2016-03-22 ENCOUNTER — Ambulatory Visit (INDEPENDENT_AMBULATORY_CARE_PROVIDER_SITE_OTHER): Payer: Medicare Other | Admitting: *Deleted

## 2016-03-22 DIAGNOSIS — Z23 Encounter for immunization: Secondary | ICD-10-CM | POA: Diagnosis not present

## 2016-03-22 DIAGNOSIS — I482 Chronic atrial fibrillation, unspecified: Secondary | ICD-10-CM

## 2016-03-22 LAB — POCT INR
INR: 2.4
PT: 29.2

## 2016-03-22 NOTE — Progress Notes (Signed)
2 mg daily. Return in 4 weeks.

## 2016-03-22 NOTE — Patient Instructions (Signed)
2 mg daily. Return in 4 weeks.

## 2016-04-17 ENCOUNTER — Other Ambulatory Visit: Payer: Self-pay | Admitting: Family Medicine

## 2016-04-17 MED ORDER — WARFARIN SODIUM 2 MG PO TABS: 2.0000 mg | ORAL_TABLET | Freq: Every day | ORAL | 3 refills | Status: DC

## 2016-04-17 NOTE — Telephone Encounter (Signed)
Pt needs refill on his warfarin 2 mg  He uses [facility]  [facility] [employer]

## 2016-04-19 ENCOUNTER — Ambulatory Visit (INDEPENDENT_AMBULATORY_CARE_PROVIDER_SITE_OTHER): Payer: Medicare Other

## 2016-04-19 DIAGNOSIS — I482 Chronic atrial fibrillation, unspecified: Secondary | ICD-10-CM

## 2016-04-19 LAB — POCT INR
INR: 2.9
PT: 34.2

## 2016-04-19 NOTE — Patient Instructions (Signed)
Anticoagulation Dose Instructions as of 04/19/2016      Tyler Kirk Tue Wed Thu Fri Sat   New Dose 2 mg 2 mg 2 mg 2 mg 2 mg 2 mg 2 mg    Description   2 mg daily F/U 4 weeks

## 2016-04-23 ENCOUNTER — Ambulatory Visit (INDEPENDENT_AMBULATORY_CARE_PROVIDER_SITE_OTHER): Payer: Medicare Other | Admitting: Family Medicine

## 2016-04-23 VITALS — BP 132/64 | HR 64 | Temp 98.1°F | Resp 16 | Wt 172.0 lb

## 2016-04-23 DIAGNOSIS — R413 Other amnesia: Secondary | ICD-10-CM

## 2016-04-23 MED ORDER — AMOXICILLIN 500 MG PO CAPS: 500.0000 mg | ORAL_CAPSULE | Freq: Three times a day (TID) | ORAL | 0 refills | Status: DC

## 2016-04-23 MED ORDER — DONEPEZIL HCL 5 MG PO TABS: 5.0000 mg | ORAL_TABLET | Freq: Every day | ORAL | 12 refills | Status: DC

## 2016-04-23 NOTE — Progress Notes (Signed)
Tyler Kirk  MRN: [id number] DOB: 1919/11/11  Subjective:  HPI  The patient is a 82 year old male who presents for follow up.  The patient could not tell me what he was supposed to follow up.  He is having some cough for about a week.  He states he feels awful.  He has head and chest congestion.  The patient did state that his memory is getting worse.  He forgot to bring his billfold to the office today.  He admits that on occasion he has forgotten how to get to the store.  He is unable to tell me any of his mediations and it was noticed last week when he came in for his INR that he was quite confused.  He spent a long time trying to find what was a large bottle of antibiotics from his jacket pocket.  He kept fumbling around with the jacket and kept going to the same pocket to find it.    He did tell me that he is having trouble with a tooth and has to have a procedure done to it.  He told me last week that the dentist gave him some medicine for him to take for the infection and he wanted to know from [place] if it would be ok to take.  However he only had 3 pills left and when asked about it he said that he was told by the dental office to take them so he was.    He is much more confused about things going from one side of a conversation to the other.   Patient Active Problem List   Diagnosis Date Noted  . Absolute anemia 12/01/2014  . Arthritis 12/01/2014  . A-fib ([place]) 12/01/2014  . Benign fibroma of prostate 12/01/2014  . Cataract 12/01/2014  . Clinical depression 12/01/2014  . Esophagitis, reflux 12/01/2014  . Fatigue 12/01/2014  . Bilateral hearing loss 12/01/2014  . Hyperplasia, prostate 12/01/2014  . Memory loss 12/01/2014  . PE (pulmonary embolism) 12/01/2014  . Basal cell papilloma 12/01/2014  . Dermatologic disease 10/17/2009  . Hypercholesterolemia without hypertriglyceridemia 05/11/2009  . Hernia, inguinal, unilateral 05/09/2006  . BP (high blood pressure) 03/28/2006   . HLD (hyperlipidemia) 03/28/2006  . Diabetes mellitus, type 2 ([place]) 03/28/2006    Past Medical History:  Diagnosis Date  . Afib ([place])   . Arthritis   . BPH (benign prostatic hypertrophy)   . Depression   . Diabetes mellitus without complication ([place])   . GERD (gastroesophageal reflux disease)   . Hernia, inguinal    Chronic Large Inguinal Hernia  . Hyperlipidemia   . Hypertension   . Pulmonary embolism [facility])     Social History   Social History  . Marital status: Widowed    Spouse name: N/A  . Number of children: N/A  . Years of education: N/A   Occupational History  . Not on file.   Social History Main Topics  . Smoking status: Never Smoker  . Smokeless tobacco: Not on file  . Alcohol use No  . Drug use: No  . Sexual activity: Not on file   Other Topics Concern  . Not on file   Social History Narrative  . No narrative on file    Outpatient Encounter Prescriptions as of 04/23/2016  Medication Sig Note  . aspirin EC 81 MG tablet Take 1 tablet on M/W/F   . fluticasone (FLONASE) 50 MCG/ACT nasal spray Place into the nose. 12/01/2014: Received from: [employer]  [employer] [employer] [facility]  . glucose blood (ONE TOUCH ULTRA TEST) test strip 1 each by Other route every morning. Use as instructed Dx:E11.9   . metFORMIN (GLUCOPHAGE) 500 MG tablet Take 1 tablet (500 mg total) by mouth daily.   . MULTIPLE VITAMIN PO Take by mouth. 12/01/2014: Received from: [facility]  . simvastatin (ZOCOR) 20 MG tablet Take 1 tablet (20 mg total) by mouth at bedtime.   . tamsulosin (FLOMAX) 0.4 MG CAPS capsule Take 1 capsule (0.4 mg total) by mouth daily.   Marland Kitchen triamcinolone ointment (KENALOG) 0.1 % TRIAMCINOLONE ACETONIDE, 0.1% (External Ointment)  small amount apply to affected area as needed for 0 days  Quantity: 61;  Refills: 0   Ordered :06-Oct-2014  Felton Clinton, [place] ;  Started 06-Oct-2014 Active Comments: Medication taken as needed. 12/01/2014: Medication taken as needed.  Received  from: [facility]  . warfarin (COUMADIN) 2 MG tablet Take 1 tablet (2 mg total) by mouth daily.   Marland Kitchen warfarin (COUMADIN) 3 MG tablet Take 1 tablet (3 mg total) by mouth daily.   Marland Kitchen warfarin (COUMADIN) 5 MG tablet Take by mouth. 12/01/2014: Received from: [facility]   No facility-administered encounter medications on file as of 04/23/2016.     No Known Allergies  Review of Systems  Constitutional: Positive for malaise/fatigue. Negative for fever.  Respiratory: Positive for cough, sputum production and wheezing. Negative for shortness of breath.   Cardiovascular: Negative for chest pain and palpitations.  Neurological: Positive for weakness. Negative for dizziness and headaches.  Psychiatric/Behavioral: Negative.       Objective:  BP 132/64   Pulse 64   Temp 98.1 F (36.7 C) (Oral)   Resp 16   Wt 172 lb (78 kg)   BMI 27.76 kg/m   Physical Exam  Constitutional: He is well-developed, well-nourished, and in no distress.  HENT:  Head: Normocephalic and atraumatic.  Eyes: Pupils are equal, round, and reactive to light.  Neck: Normal range of motion. Neck supple.  Cardiovascular: Normal rate, regular rhythm and normal heart sounds.   Pulmonary/Chest: Effort normal and breath sounds normal.  Abdominal: Soft.  Neurological: He is alert.  Skin: Skin is warm and dry.  Psychiatric: Mood and affect normal.  He is pleasant and cooperative but his cognition appears to be slowly slipping.    Assessment and Plan :   1. Memory loss/Cognitive impairment/early dementia--most likely Alzheimer's type  - donepezil (ARICEPT) 5 MG tablet; Take 1 tablet (5 mg total) by mouth at bedtime.  Dispense: 30 tablet; Refill: 12 Have advised the patient not to drive a car. Started discussion with the daughter today about the future. I think he would continue to decline at age 71.He is still living alone 2. BPH 3. Type 2 diabetes 4 history of PE--lifelong  anticoagulation as long as benefits outweigh risk 5. Hyperlipidemia Stop statin at this point and as I think risk outweigh benefits.  I have done the exam and reviewed the chart and it is accurate to the best of my knowledge. Miguel Aschoff M.D. [facility]     HPI, Exam and A&P Transcribed under the direction and in the presence of Wilhemena Durie., MD. Electronically Signed: Althea Charon, [place]

## 2016-04-29 ENCOUNTER — Ambulatory Visit: Payer: Medicare Other | Admitting: Family Medicine

## 2016-05-03 ENCOUNTER — Telehealth: Payer: Self-pay | Admitting: Family Medicine

## 2016-05-03 NOTE — Telephone Encounter (Signed)
Spoke with [name], daughter, advised her that patient came by the office confused about 2 medications with notes of what to do from her daughter. He did not call his daughter to discuss these questions. Jiles Garter and [place] talked to the patient and explained the questions he had about the medications. This is not the first time patient came up to the office with questions. Also advised daughter that dr [name] does not want patient to drive for safety reasons for the patient and others around him. Daughter will check on the patient later and make sure everything is ok.-aa

## 2016-05-03 NOTE — Telephone Encounter (Signed)
Pt's daughter called sying Wilhemena Durie had called her.  She is returning Ana's call.  Her call back is [phone]  Thanks Con Memos

## 2016-05-09 ENCOUNTER — Ambulatory Visit
Admission: RE | Admit: 2016-05-09 | Discharge: 2016-05-09 | Disposition: A | Discharge status: Home / Self Care | Payer: Medicare Other | Source: Ambulatory Visit | Attending: Family Medicine | Admitting: Family Medicine

## 2016-05-09 ENCOUNTER — Ambulatory Visit (INDEPENDENT_AMBULATORY_CARE_PROVIDER_SITE_OTHER): Payer: Medicare Other | Admitting: Family Medicine

## 2016-05-09 VITALS — BP 132/78 | HR 68 | Temp 97.8°F | Resp 16 | Wt 171.0 lb

## 2016-05-09 DIAGNOSIS — M25551 Pain in right hip: Secondary | ICD-10-CM | POA: Insufficient documentation

## 2016-05-09 DIAGNOSIS — M545 Low back pain, unspecified: Secondary | ICD-10-CM

## 2016-05-09 DIAGNOSIS — M1611 Unilateral primary osteoarthritis, right hip: Secondary | ICD-10-CM | POA: Diagnosis not present

## 2016-05-09 MED ORDER — TRAMADOL HCL 50 MG PO TABS: 50.0000 mg | ORAL_TABLET | Freq: Three times a day (TID) | ORAL | 0 refills | Status: DC | PRN

## 2016-05-09 NOTE — Progress Notes (Signed)
Advised  ED 

## 2016-05-09 NOTE — Progress Notes (Signed)
Tyler Kirk  MRN: [id number] DOB: 1919/10/27  Subjective:  HPI   The patient is a 82 year old male who presents for evaluation of right sided back pan.  The patient states it began about 6 days ago.  It is making it a little hard for him to walk. He reports that it hurts with any movement.  He ha not taken anything for the pain.  He has no radiation of the pain and no urinary symptoms. He is not complaining of anything regarding his memory today but last week he came to the office in a confused state. Dementia is becoming progressive for this patient ,he still lives alone but I'm not sure how much longer he can do this safely  Patient Active Problem List   Diagnosis Date Noted  . Absolute anemia 12/01/2014  . Arthritis 12/01/2014  . A-fib ([place]) 12/01/2014  . Benign fibroma of prostate 12/01/2014  . Cataract 12/01/2014  . Clinical depression 12/01/2014  . Esophagitis, reflux 12/01/2014  . Fatigue 12/01/2014  . Bilateral hearing loss 12/01/2014  . Hyperplasia, prostate 12/01/2014  . Memory loss 12/01/2014  . PE (pulmonary embolism) 12/01/2014  . Basal cell papilloma 12/01/2014  . Dermatologic disease 10/17/2009  . Hypercholesterolemia without hypertriglyceridemia 05/11/2009  . Hernia, inguinal, unilateral 05/09/2006  . BP (high blood pressure) 03/28/2006  . HLD (hyperlipidemia) 03/28/2006  . Diabetes mellitus, type 2 ([place]) 03/28/2006    Past Medical History:  Diagnosis Date  . Afib ([place])   . Arthritis   . BPH (benign prostatic hypertrophy)   . Depression   . Diabetes mellitus without complication ([place])   . GERD (gastroesophageal reflux disease)   . Hernia, inguinal    Chronic Large Inguinal Hernia  . Hyperlipidemia   . Hypertension   . Pulmonary embolism [facility])     Social History   Social History  . Marital status: Widowed    Spouse name: N/A  . Number of children: N/A  . Years of education: N/A   Occupational History  . Not on file.   Social History  Main Topics  . Smoking status: Never Smoker  . Smokeless tobacco: Not on file  . Alcohol use No  . Drug use: No  . Sexual activity: Not on file   Other Topics Concern  . Not on file   Social History Narrative  . No narrative on file    Outpatient Encounter Prescriptions as of 05/09/2016  Medication Sig Note  . aspirin EC 81 MG tablet Take 1 tablet on M/W/F   . donepezil (ARICEPT) 5 MG tablet Take 1 tablet (5 mg total) by mouth at bedtime.   . fluticasone (FLONASE) 50 MCG/ACT nasal spray Place into the nose. 12/01/2014: Received from: [facility]  . glucose blood (ONE TOUCH ULTRA TEST) test strip 1 each by Other route every morning. Use as instructed Dx:E11.9   . metFORMIN (GLUCOPHAGE) 500 MG tablet Take 1 tablet (500 mg total) by mouth daily.   . MULTIPLE VITAMIN PO Take by mouth. 12/01/2014: Received from: [facility]  . tamsulosin (FLOMAX) 0.4 MG CAPS capsule Take 1 capsule (0.4 mg total) by mouth daily.   Marland Kitchen triamcinolone ointment (KENALOG) 0.1 % TRIAMCINOLONE ACETONIDE, 0.1% (External Ointment)  small amount apply to affected area as needed for 0 days  Quantity: 61;  Refills: 0   Ordered :06-Oct-2014  Felton Clinton, [place] ;  Started 06-Oct-2014 Active Comments: Medication taken as needed. 12/01/2014: Medication taken as needed.  Received from: [facility]  . warfarin (COUMADIN) 2 MG tablet Take 1 tablet (2 mg total) by mouth daily.   Marland Kitchen warfarin (COUMADIN) 3 MG tablet Take 1 tablet (3 mg total) by mouth daily.   Marland Kitchen warfarin (COUMADIN) 5 MG tablet Take by mouth. 12/01/2014: Received from: [facility]  . [DISCONTINUED] amoxicillin (AMOXIL) 500 MG capsule Take 1 capsule (500 mg total) by mouth 3 (three) times daily.    No facility-administered encounter medications on file as of 05/09/2016.     No Known Allergies  Review of Systems  Constitutional: Negative for chills, fever and malaise/fatigue.  Respiratory: Negative for cough,  shortness of breath and wheezing.   Cardiovascular: Negative for chest pain and palpitations.  Genitourinary: Negative for dysuria, flank pain, frequency, hematuria and urgency.  Musculoskeletal: Positive for back pain.       Patient does complain of being off balance and weaker in the morning with improvement as the day progresses Does not prevent him from doing his walking  Neurological: Negative for weakness.    Objective:  BP 132/78 (BP Location: Right Arm, Patient Position: Sitting, Cuff Size: Normal)   Pulse 68   Temp 97.8 F (36.6 C) (Oral)   Resp 16   Wt 171 lb (77.6 kg)   BMI 27.60 kg/m   Physical Exam  Constitutional: He is well-developed, well-nourished, and in no distress.  HENT:  Head: Normocephalic and atraumatic.  Eyes: Conjunctivae are normal. No scleral icterus.  Neck: No thyromegaly present.  Cardiovascular: Normal rate, regular rhythm and normal heart sounds.   Pulmonary/Chest: Effort normal and breath sounds normal.  Abdominal: Soft.  Musculoskeletal: Normal range of motion.  Very minimal tenderness in the region of the right SI joint. No vertebral body tenderness.  Neurological: He is alert.  Skin: Skin is warm and dry. No rash noted.  Psychiatric: Mood and affect normal.    Assessment and Plan :   1. Right-sided low back pain without sciatica, unspecified chronicity  - DG Lumbar Spine Complete; Future - traMADol (ULTRAM) 50 MG tablet; Take 1 tablet (50 mg total) by mouth every 8 (eight) hours as needed.  Dispense: 30 tablet; Refill: 0  2. Pain of right hip joint  - DG HIP UNILAT WITH PELVIS 2-3 VIEWS RIGHT; Future - traMADol (ULTRAM) 50 MG tablet; Take 1 tablet (50 mg total) by mouth every 8 (eight) hours as needed.  Dispense: 30 tablet; Refill: 0 3. Mild Alzheimer's type dementia Slowly progressive over the past year. Told patient and his daughter that I do not think that she should be driving. This is a precaution. Than 50% of this visit is spent  in counseling regarding issues. 4 BPH 5.afib 6.PE Lifelong anticoagulation 7. Hearing loss HPI, Exam and A&P Transcribed under the direction and in the presence of Miguel Aschoff, Brooke Bonito., MD. Electronically Signed: Althea Charon, RMA I have done the exam and reviewed the chart and it is accurate to the best of my knowledge. Development worker, community has been used and  any errors in dictation or transcription are unintentional. Miguel Aschoff M.D. [facility]

## 2016-05-14 ENCOUNTER — Telehealth: Payer: Self-pay | Admitting: Family Medicine

## 2016-05-14 NOTE — Telephone Encounter (Signed)
Ok with me 

## 2016-05-14 NOTE — Telephone Encounter (Signed)
Ok to do?-aa 

## 2016-05-14 NOTE — Telephone Encounter (Signed)
Pt daughter [name] called states states pt is have dental work done/tooth extracted 05/21/16 and the dentist is asking if pt can come on Monday 05/20/16 instead of Friday 05/17/16 for PT so the dentist can see his numbers before the procedure.  Please advise.  [id number]

## 2016-05-14 NOTE — Telephone Encounter (Signed)
Patient's daughter was advised that it was ok per Dr.Gilbert. Patient was scheduled with [facility] for PT only Monday 05/20/16 at 9:45.  Thanks,  -Sharlette Jansma

## 2016-05-17 ENCOUNTER — Ambulatory Visit: Payer: Medicare Other

## 2016-05-20 ENCOUNTER — Ambulatory Visit (INDEPENDENT_AMBULATORY_CARE_PROVIDER_SITE_OTHER): Payer: Medicare Other | Admitting: Emergency Medicine

## 2016-05-20 DIAGNOSIS — I482 Chronic atrial fibrillation, unspecified: Secondary | ICD-10-CM

## 2016-05-20 LAB — POCT INR
INR: 2.8
PT: 33.4

## 2016-05-20 NOTE — Patient Instructions (Signed)
Anticoagulation Dose Instructions as of 05/20/2016      Tyler Kirk Tue Wed Thu Fri Sat   New Dose 2 mg 2 mg 2 mg 2 mg 2 mg 2 mg 2 mg    Description   2 mg daily F/U 4 weeks

## 2016-06-05 ENCOUNTER — Ambulatory Visit: Payer: Medicare Other | Admitting: Family Medicine

## 2016-06-10 ENCOUNTER — Telehealth: Payer: Self-pay | Admitting: Family Medicine

## 2016-06-10 NOTE — Telephone Encounter (Signed)
Please review-aa 

## 2016-06-10 NOTE — Telephone Encounter (Signed)
Pt daughter [name] called stating pt started a new medication in November for memory loss.  She states pt is having strange and sometimes scary dreams.  Pt also states sometimes he wakes up feeling disoriented and not sure of where he is.  Pt daughter is asking if this is coming from the new medication.  CB#[phone]/MW

## 2016-06-10 NOTE — Telephone Encounter (Signed)
Pt daughter informed and voiced understanding of results.

## 2016-06-10 NOTE — Telephone Encounter (Signed)
Stop donepezil and see if dreams go away.

## 2016-06-19 ENCOUNTER — Ambulatory Visit (INDEPENDENT_AMBULATORY_CARE_PROVIDER_SITE_OTHER): Payer: Medicare Other

## 2016-06-19 DIAGNOSIS — I482 Chronic atrial fibrillation, unspecified: Secondary | ICD-10-CM

## 2016-06-19 LAB — POCT INR
INR: 1.8
PT: 22.1

## 2016-06-19 NOTE — Patient Instructions (Signed)
INR as of 06/19/2016 and Previous Dosing Information    INR Dt INR Goal Tyler Kirk Sun Mon Tue Wed Thu Fri Sat   06/19/2016 1.8 2.0-3.0 14 mg 2 mg 2 mg 2 mg 2 mg 2 mg 2 mg 2 mg    Previous description   2 mg daily F/U 4 weeks   Anticoagulation Dose Instructions as of 06/19/2016      Total Sun Mon Tue Wed Thu Fri Sat   New Dose 14 mg 2 mg 2 mg 2 mg 2 mg 2 mg 2 mg 2 mg     (2 mg x 1)  (2 mg x 1)  (2 mg x 1)  (2 mg x 1)  (2 mg x 1)  (2 mg x 1)  (2 mg x 1)                         Description   3 mg daily F/U 1 weeks

## 2016-06-26 ENCOUNTER — Ambulatory Visit (INDEPENDENT_AMBULATORY_CARE_PROVIDER_SITE_OTHER): Payer: Medicare Other

## 2016-06-26 DIAGNOSIS — I482 Chronic atrial fibrillation, unspecified: Secondary | ICD-10-CM

## 2016-06-26 LAB — POCT INR
INR: 3.3
PT: 39.5

## 2016-06-26 NOTE — Patient Instructions (Signed)
Anticoagulation Dose Instructions as of 06/26/2016      Tyler Kirk Tue Wed Thu Fri Sat   New Dose 2 mg 2 mg 2 mg 3 mg 2 mg 3 mg 2 mg    Description   3 mg daily except 2 mg W and F F/U 1 weeks

## 2016-07-02 ENCOUNTER — Ambulatory Visit (INDEPENDENT_AMBULATORY_CARE_PROVIDER_SITE_OTHER): Payer: Medicare Other | Admitting: Family Medicine

## 2016-07-02 VITALS — BP 128/76 | HR 72 | Temp 98.4°F | Resp 16 | Wt 172.0 lb

## 2016-07-02 DIAGNOSIS — E118 Type 2 diabetes mellitus with unspecified complications: Secondary | ICD-10-CM | POA: Diagnosis not present

## 2016-07-02 DIAGNOSIS — I482 Chronic atrial fibrillation, unspecified: Secondary | ICD-10-CM

## 2016-07-02 DIAGNOSIS — G3184 Mild cognitive impairment, so stated: Secondary | ICD-10-CM

## 2016-07-02 DIAGNOSIS — M545 Low back pain, unspecified: Secondary | ICD-10-CM

## 2016-07-02 LAB — POCT INR
INR: 4.2
PT: 50.3

## 2016-07-02 LAB — POCT GLYCOSYLATED HEMOGLOBIN (HGB A1C): Hemoglobin A1C: 7.1

## 2016-07-02 NOTE — Patient Instructions (Addendum)
Take Warfarin 2 mg daily and re check in 1 week. Anticoagulation Dose Instructions as of 07/02/2016      Tyler Kirk Tue Wed Thu Fri Sat   New Dose 2 mg 2 mg 2 mg 2 mg 2 mg 2 mg 2 mg    Description   2 mg daily. F/U 2 weeks

## 2016-07-02 NOTE — Progress Notes (Signed)
Tyler Kirk  MRN: [id number] DOB: 10/18/1919  Subjective:  HPI  Patient is here for follow up. LOV was 05/09/16 for back and hip pain. Right side. Xrays were done and showed arthritic changes. Started Tramadol and patient states he had some relief with this. His pain is almost completely gone.  He is also due for recheck on INR. Last INR check was 06/26/16 and it was 3.3-directions were to take 3 mg daily except 2 mg on Wednesday and Friday but he thinks he is taking the 2 mg on Thursday and Saturday. INR today is 4.2.  Patient admits to increasing memory issues lately. He is still able to function and lives alone. He has 4 daughters [address] area that bring him food almost daily  Patient Active Problem List   Diagnosis Date Noted  . Absolute anemia 12/01/2014  . Arthritis 12/01/2014  . A-fib ([place]) 12/01/2014  . Benign fibroma of prostate 12/01/2014  . Cataract 12/01/2014  . Clinical depression 12/01/2014  . Esophagitis, reflux 12/01/2014  . Fatigue 12/01/2014  . Bilateral hearing loss 12/01/2014  . Hyperplasia, prostate 12/01/2014  . Memory loss 12/01/2014  . PE (pulmonary embolism) 12/01/2014  . Basal cell papilloma 12/01/2014  . Dermatologic disease 10/17/2009  . Hypercholesterolemia without hypertriglyceridemia 05/11/2009  . Hernia, inguinal, unilateral 05/09/2006  . BP (high blood pressure) 03/28/2006  . HLD (hyperlipidemia) 03/28/2006  . Diabetes mellitus, type 2 ([place]) 03/28/2006    Past Medical History:  Diagnosis Date  . Afib ([place])   . Arthritis   . BPH (benign prostatic hypertrophy)   . Depression   . Diabetes mellitus without complication ([place])   . GERD (gastroesophageal reflux disease)   . Hernia, inguinal    Chronic Large Inguinal Hernia  . Hyperlipidemia   . Hypertension   . Pulmonary embolism [facility])     Social History   Social History  . Marital status: Widowed    Spouse name: N/A  . Number of children: N/A  . Years of education: N/A    Occupational History  . Not on file.   Social History Main Topics  . Smoking status: Never Smoker  . Smokeless tobacco: Not on file  . Alcohol use No  . Drug use: No  . Sexual activity: Not on file   Other Topics Concern  . Not on file   Social History Narrative  . No narrative on file    Outpatient Encounter Prescriptions as of 07/02/2016  Medication Sig Note  . aspirin EC 81 MG tablet Take 1 tablet on M/W/F   . donepezil (ARICEPT) 5 MG tablet Take 1 tablet (5 mg total) by mouth at bedtime.   . fluticasone (FLONASE) 50 MCG/ACT nasal spray Place into the nose. 12/01/2014: Received from: [facility]  . glucose blood (ONE TOUCH ULTRA TEST) test strip 1 each by Other route every morning. Use as instructed Dx:E11.9   . metFORMIN (GLUCOPHAGE) 500 MG tablet Take 1 tablet (500 mg total) by mouth daily.   . MULTIPLE VITAMIN PO Take by mouth. 12/01/2014: Received from: [facility]  . tamsulosin (FLOMAX) 0.4 MG CAPS capsule Take 1 capsule (0.4 mg total) by mouth daily.   . traMADol (ULTRAM) 50 MG tablet Take 1 tablet (50 mg total) by mouth every 8 (eight) hours as needed.   . triamcinolone ointment (KENALOG) 0.1 % TRIAMCINOLONE ACETONIDE, 0.1% (External Ointment)  small amount apply to affected area as needed for 0 days  Quantity: 61;  Refills: 0  Ordered :06-Oct-2014  Webb Laws ;  Started 06-Oct-2014 Active Comments: Medication taken as needed. 12/01/2014: Medication taken as needed.  Received from: [facility]  . warfarin (COUMADIN) 2 MG tablet Take 1 tablet (2 mg total) by mouth daily.   Marland Kitchen warfarin (COUMADIN) 3 MG tablet Take 1 tablet (3 mg total) by mouth daily.   Marland Kitchen warfarin (COUMADIN) 5 MG tablet Take by mouth. 12/01/2014: Received from: [facility]   No facility-administered encounter medications on file as of 07/02/2016.     No Known Allergies  Review of Systems  Constitutional: Negative.   Respiratory: Negative.    Cardiovascular: Negative.   Musculoskeletal: Positive for back pain and joint pain.  Endo/Heme/Allergies: Bruises/bleeds easily.  Psychiatric/Behavioral: Negative.     Objective:  BP 128/76   Pulse 72   Temp 98.4 F (36.9 C)   Resp 16   Wt 172 lb (78 kg)   BMI 27.76 kg/m   Physical Exam  Constitutional: He is oriented to person, place, and time and well-developed, well-nourished, and in no distress.  HENT:  Head: Normocephalic and atraumatic.  Right Ear: External ear normal.  Left Ear: External ear normal.  Nose: Nose normal.  Eyes: No scleral icterus.  Neck: No thyromegaly present.  Cardiovascular: Normal rate, regular rhythm and normal heart sounds.   Pulmonary/Chest: Effort normal and breath sounds normal.  Abdominal: Soft.  Neurological: He is alert and oriented to person, place, and time. Gait normal. GCS score is 15.  Skin: Skin is warm and dry.  Psychiatric: Mood, memory, affect and judgment normal.    Assessment and Plan :  1. Right-sided low back pain without sciatica, unspecified chronicity Much better.  2. Chronic atrial fibrillation (HCC) INR 4.2, re check on next visit. Take 2 mg daily. - POCT INR  3. Type 2 diabetes mellitus with complication, unspecified long term insulin use status (HCC) A1C 7.1 stable. - POCT HgB A1C--7.,1 today.  4. MCI (mild cognitive impairment) MMSE 26/30. We'll follow this with patient and family. He is not driving. 5. Unprovoked pulmonary embolus Lifelong anticoagulation. HPI, Exam and A&P transcribed under direction and in the presence of Miguel Aschoff, MD. I have done the exam and reviewed the chart and it is accurate to the best of my knowledge. Development worker, community has been used and  any errors in dictation or transcription are unintentional. Miguel Aschoff M.D. [facility]

## 2016-07-17 ENCOUNTER — Ambulatory Visit: Payer: Medicare Other | Admitting: Family Medicine

## 2016-07-25 ENCOUNTER — Ambulatory Visit: Payer: Medicare Other | Admitting: Family Medicine

## 2016-07-29 ENCOUNTER — Ambulatory Visit (INDEPENDENT_AMBULATORY_CARE_PROVIDER_SITE_OTHER): Payer: Medicare Other | Admitting: Family Medicine

## 2016-07-29 DIAGNOSIS — I482 Chronic atrial fibrillation, unspecified: Secondary | ICD-10-CM

## 2016-07-29 LAB — POCT INR
INR: 2.2
PT: 25.9

## 2016-07-29 NOTE — Patient Instructions (Signed)
Anticoagulation Dose Instructions as of 07/29/2016      Tyler Kirk Tue Wed Thu Fri Sat   New Dose 2 mg 2 mg 2 mg 2 mg 2 mg 2 mg 2 mg    Description   2 mg daily. F/U4 weeks

## 2016-07-29 NOTE — Progress Notes (Signed)
Tyler Kirk  MRN: [id number] DOB: 1920-06-14  Subjective:  HPI   The patient is a 83 year old male who presents for follow up of his back pain.  He was last seen on 07/02/16 and reported that he was doing better.  He had been taking Tylenol for the pain.   He is also here to recheck his INR.  He was last tested on 07/02/16 and it was 4.2.  That was an increase after the previous one being 3.3.  Per the notes he should be taking 2 mg daily which is down from his previous dose of 3 mg 5 days and 2 mg 2 days per week.  Patient has questions about itching.  He wants to know if his diet can be the cause of him having itching.  He states he has had this for a few months.  He states it is worst on his arms and elbows but he does get it in several other areas also.  He has used a few different creams with the last one being Eucerin cream but notes that non of them helped.  Patient Active Problem List   Diagnosis Date Noted  . Absolute anemia 12/01/2014  . Arthritis 12/01/2014  . A-fib ([place]) 12/01/2014  . Benign fibroma of prostate 12/01/2014  . Cataract 12/01/2014  . Clinical depression 12/01/2014  . Esophagitis, reflux 12/01/2014  . Fatigue 12/01/2014  . Bilateral hearing loss 12/01/2014  . Hyperplasia, prostate 12/01/2014  . Memory loss 12/01/2014  . PE (pulmonary embolism) 12/01/2014  . Basal cell papilloma 12/01/2014  . Dermatologic disease 10/17/2009  . Hypercholesterolemia without hypertriglyceridemia 05/11/2009  . Hernia, inguinal, unilateral 05/09/2006  . BP (high blood pressure) 03/28/2006  . HLD (hyperlipidemia) 03/28/2006  . Diabetes mellitus, type 2 ([place]) 03/28/2006    Past Medical History:  Diagnosis Date  . Afib ([place])   . Arthritis   . BPH (benign prostatic hypertrophy)   . Depression   . Diabetes mellitus without complication ([place])   . GERD (gastroesophageal reflux disease)   . Hernia, inguinal    Chronic Large Inguinal Hernia  . Hyperlipidemia   .  Hypertension   . Pulmonary embolism [facility])     Social History   Social History  . Marital status: Widowed    Spouse name: N/A  . Number of children: N/A  . Years of education: N/A   Occupational History  . Not on file.   Social History Main Topics  . Smoking status: Never Smoker  . Smokeless tobacco: Not on file  . Alcohol use No  . Drug use: No  . Sexual activity: Not on file   Other Topics Concern  . Not on file   Social History Narrative  . No narrative on file    Outpatient Encounter Prescriptions as of 07/29/2016  Medication Sig Note  . aspirin EC 81 MG tablet Take 1 tablet on M/W/F   . fluticasone (FLONASE) 50 MCG/ACT nasal spray Place into the nose. 12/01/2014: Received from: [facility]  . glucose blood (ONE TOUCH ULTRA TEST) test strip 1 each by Other route every morning. Use as instructed Dx:E11.9   . metFORMIN (GLUCOPHAGE) 500 MG tablet Take 1 tablet (500 mg total) by mouth daily.   . MULTIPLE VITAMIN PO Take by mouth. 12/01/2014: Received from: [facility]  . tamsulosin (FLOMAX) 0.4 MG CAPS capsule Take 1 capsule (0.4 mg total) by mouth daily.   Marland Kitchen warfarin (COUMADIN) 2 MG tablet Take 1 tablet (  2 mg total) by mouth daily.   Marland Kitchen warfarin (COUMADIN) 3 MG tablet Take 1 tablet (3 mg total) by mouth daily.   Marland Kitchen warfarin (COUMADIN) 5 MG tablet Take by mouth. 12/01/2014: Received from: [facility]  . [DISCONTINUED] donepezil (ARICEPT) 5 MG tablet Take 1 tablet (5 mg total) by mouth at bedtime.   . [DISCONTINUED] traMADol (ULTRAM) 50 MG tablet Take 1 tablet (50 mg total) by mouth every 8 (eight) hours as needed.   . [DISCONTINUED] triamcinolone ointment (KENALOG) 0.1 % TRIAMCINOLONE ACETONIDE, 0.1% (External Ointment)  small amount apply to affected area as needed for 0 days  Quantity: 61;  Refills: 0   Ordered :06-Oct-2014  Felton [place], [place] ;  Started 06-Oct-2014 Active Comments: Medication taken as needed. 12/01/2014: Medication  taken as needed.  Received from: [facility]   No facility-administered encounter medications on file as of 07/29/2016.     No Known Allergies  Review of Systems  Constitutional: Negative for fever and malaise/fatigue.  Eyes: Negative.   Respiratory: Negative for cough, hemoptysis, shortness of breath and wheezing.   Cardiovascular: Positive for chest pain (on occassion). Negative for palpitations and orthopnea.  Gastrointestinal: Negative.   Skin: Positive for itching.  Neurological: Negative.  Negative for weakness.  Endo/Heme/Allergies: Negative.   Psychiatric/Behavioral: Negative.     Objective:  BP 132/60 (BP Location: Right Arm, Patient Position: Sitting, Cuff Size: Normal)   Pulse 72   Temp 97.7 F (36.5 C) (Oral)   Resp 16   Wt 173 lb (78.5 kg)   BMI 27.92 kg/m   Physical Exam  Constitutional: He is oriented to person, place, and time and well-developed, well-nourished, and in no distress.  HENT:  Head: Normocephalic and atraumatic.  Right Ear: External ear normal.  Left Ear: External ear normal.  Nose: Nose normal.  Eyes: Conjunctivae are normal. Pupils are equal, round, and reactive to light.  Neck: Normal range of motion.  Cardiovascular: Normal rate, regular rhythm and normal heart sounds.   Pulmonary/Chest: Effort normal and breath sounds normal.  Abdominal: Soft.  Musculoskeletal: He exhibits edema (trace, evidence of venous stasus changes).  Neurological: He is alert and oriented to person, place, and time. Gait normal.  Skin: Skin is warm and dry.  Psychiatric: Mood, memory, affect and judgment normal.    Assessment and Plan :   1. Chronic atrial fibrillation (HCC)  - POCT INR 2.TIIDM 3.Eczema/Dry skin Try humidifier for the winter and also use moisturizing lotion. 4.MCI 5.SKs of hands /forearms   HPI, Exam and A&P Transcribed under the direction and in the presence of Miguel Aschoff, Brooke Bonito., MD. Electronically Signed: Althea Charon, RMA I have done the exam and reviewed the chart and it is accurate to the best of my knowledge. Development worker, community has been used and  any errors in dictation or transcription are unintentional. Miguel Aschoff M.D. [facility]

## 2016-08-28 ENCOUNTER — Ambulatory Visit (INDEPENDENT_AMBULATORY_CARE_PROVIDER_SITE_OTHER): Payer: Medicare Other

## 2016-08-28 DIAGNOSIS — I482 Chronic atrial fibrillation, unspecified: Secondary | ICD-10-CM

## 2016-08-28 LAB — POCT INR
INR: 2.2
PT: 26.2

## 2016-08-28 NOTE — Patient Instructions (Signed)
Anticoagulation Dose Instructions as of 08/28/2016      Tyler Kirk Tue Wed Thu Fri Sat   New Dose 2 mg 2 mg 2 mg 2 mg 2 mg 2 mg 2 mg    Description   2 mg daily. F/U4 weeks

## 2016-09-10 ENCOUNTER — Telehealth: Payer: Self-pay | Admitting: Family Medicine

## 2016-09-10 NOTE — Telephone Encounter (Signed)
Called Pt to schedule AWV with [facility] - knb °

## 2016-09-17 ENCOUNTER — Telehealth: Payer: Self-pay | Admitting: Emergency Medicine

## 2016-09-17 NOTE — Telephone Encounter (Signed)
Pt stopped by today and gave a news paper clip about Lipogen PS plus. It is a pill for memory loss. Active ingredient is Phosphatidylserine. I have sent the article to your office. Pt would like to know if he sound consider taking this for his memory. Please advise. Thanks.

## 2016-09-17 NOTE — Telephone Encounter (Signed)
Pt advised-aa 

## 2016-09-17 NOTE — Telephone Encounter (Signed)
It is safe to take this--would take with Fish oil daily.

## 2016-09-25 ENCOUNTER — Ambulatory Visit (INDEPENDENT_AMBULATORY_CARE_PROVIDER_SITE_OTHER): Payer: Medicare Other

## 2016-09-25 DIAGNOSIS — I482 Chronic atrial fibrillation, unspecified: Secondary | ICD-10-CM

## 2016-09-25 LAB — POCT INR
INR: 2.2
PT: 26.4

## 2016-09-25 NOTE — Progress Notes (Signed)
Anticoagulation Dose Instructions as of 09/25/2016      Tyler Kirk Tue Wed Thu Fri Sat   New Dose 2 mg 2 mg 2 mg 2 mg 2 mg 2 mg 2 mg    Description   2 mg daily. F/U 4 weeks

## 2016-10-18 ENCOUNTER — Other Ambulatory Visit: Payer: Self-pay | Admitting: Family Medicine

## 2016-10-18 ENCOUNTER — Telehealth: Payer: Self-pay | Admitting: Family Medicine

## 2016-10-22 LAB — HM DIABETES EYE EXAM

## 2016-10-23 ENCOUNTER — Ambulatory Visit (INDEPENDENT_AMBULATORY_CARE_PROVIDER_SITE_OTHER): Payer: Medicare Other

## 2016-10-23 DIAGNOSIS — I482 Chronic atrial fibrillation, unspecified: Secondary | ICD-10-CM

## 2016-10-23 LAB — POCT INR
INR: 2.4
PT: 28.9

## 2016-10-23 NOTE — Patient Instructions (Signed)
Anticoagulation Dose Instructions as of 10/23/2016      Dorene Grebe Tue Wed Thu Fri Sat   New Dose 2 mg 2 mg 2 mg 2 mg 2 mg 2 mg 2 mg    Description   2 mg daily. F/U 4 weeks

## 2016-11-12 ENCOUNTER — Encounter: Payer: Self-pay | Admitting: Family Medicine

## 2016-11-12 ENCOUNTER — Ambulatory Visit (INDEPENDENT_AMBULATORY_CARE_PROVIDER_SITE_OTHER): Payer: Medicare Other | Admitting: Family Medicine

## 2016-11-12 VITALS — BP 118/78 | HR 80 | Temp 98.2°F | Resp 16 | Wt 173.0 lb

## 2016-11-12 DIAGNOSIS — R319 Hematuria, unspecified: Secondary | ICD-10-CM

## 2016-11-12 LAB — POCT UA - MICROSCOPIC ONLY
BACTERIA, U MICROSCOPIC: NEGATIVE
CASTS, UR, LPF, POC: NEGATIVE
CRYSTALS, UR, HPF, POC: NEGATIVE
EPITHELIAL CELLS, URINE PER MICROSCOPY: NEGATIVE
Mucus, UA: NEGATIVE
YEAST UA: NEGATIVE

## 2016-11-12 LAB — POCT URINALYSIS DIPSTICK
BILIRUBIN UA: NEGATIVE
Glucose, UA: NEGATIVE
KETONES UA: NEGATIVE
NITRITE UA: NEGATIVE
Spec Grav, UA: 1.02 (ref 1.010–1.025)
Urobilinogen, UA: 0.2 E.U./dL
pH, UA: 6 (ref 5.0–8.0)

## 2016-11-12 MED ORDER — SULFAMETHOXAZOLE-TRIMETHOPRIM 400-80 MG PO TABS: 1.0000 | ORAL_TABLET | Freq: Two times a day (BID) | ORAL | 0 refills | Status: DC

## 2016-11-12 NOTE — Progress Notes (Signed)
Patient: Tyler Kirk Male    DOB: 1919/09/14   83 y.o.   MRN: [id number] Visit Date: 11/12/2016  Today's Provider: Wilhemena Durie, MD   Chief Complaint  Patient presents with  . Hematuria   Subjective:    Hematuria  This is a new problem. The current episode started in the past 7 days. The problem is unchanged. He describes the hematuria as gross hematuria. He reports no clotting in his urine stream. He is experiencing no pain. He describes his urine color as dark red. Irritative symptoms include frequency. Pertinent negatives include no abdominal pain, dysuria, fever or flank pain.   Patient reports that he has never had these symptoms before. He denies any pain or burning on urination.     No Known Allergies   Current Outpatient Prescriptions:  .  aspirin EC 81 MG tablet, Take 1 tablet on M/W/F, Disp: , Rfl:  .  fluticasone (FLONASE) 50 MCG/ACT nasal spray, Place into the nose., Disp: , Rfl:  .  glucose blood (ONE TOUCH ULTRA TEST) test strip, 1 each by Other route every morning. Use as instructed Dx:E11.9, Disp: 100 each, Rfl: 12 .  metFORMIN (GLUCOPHAGE) 500 MG tablet, TAKE ONE TABLET BY MOUTH ONCE DAILY, Disp: 90 tablet, Rfl: 3 .  MULTIPLE VITAMIN PO, Take by mouth., Disp: , Rfl:  .  tamsulosin (FLOMAX) 0.4 MG CAPS capsule, TAKE ONE CAPSULE BY MOUTH ONCE DAILY, Disp: 90 capsule, Rfl: 3 .  warfarin (COUMADIN) 2 MG tablet, Take 1 tablet (2 mg total) by mouth daily., Disp: 90 tablet, Rfl: 3 .  warfarin (COUMADIN) 3 MG tablet, Take 1 tablet (3 mg total) by mouth daily., Disp: 30 tablet, Rfl: 12 .  warfarin (COUMADIN) 5 MG tablet, Take by mouth., Disp: , Rfl:   Review of Systems  Constitutional: Negative for fever.  Eyes: Negative.   Respiratory: Negative.   Cardiovascular: Negative.   Gastrointestinal: Negative for abdominal pain.  Genitourinary: Positive for frequency and hematuria. Negative for dysuria and flank pain.  Allergic/Immunologic: Negative.     Psychiatric/Behavioral: Negative.     Social History  Substance Use Topics  . Smoking status: Never Smoker  . Smokeless tobacco: Never Used  . Alcohol use No   Objective:   BP 118/78 (BP Location: Right Arm, Patient Position: Sitting, Cuff Size: Normal)   Pulse 80   Temp 98.2 F (36.8 C)   Resp 16   Wt 173 lb (78.5 kg)   SpO2 95%   BMI 27.92 kg/m  Vitals:   11/12/16 1348 11/12/16 1403  BP: 118/78   Pulse: (!) 120 80  Resp: (!) 24 16  Temp: 98.2 F (36.8 C)   SpO2: 95%   Weight: 173 lb (78.5 kg)      Physical Exam  Constitutional: He is oriented to person, place, and time. He appears well-developed and well-nourished.  HENT:  Head: Atraumatic.  Eyes: Conjunctivae are normal.  Neck: No thyromegaly present.  Cardiovascular: Normal rate, regular rhythm and normal heart sounds.   Pulmonary/Chest: Effort normal and breath sounds normal.  Abdominal: Soft. Bowel sounds are normal.  Genitourinary: Penis normal. No penile tenderness.  Neurological: He is alert and oriented to person, place, and time.  Skin: Skin is warm and dry.  Very fair skin.  Psychiatric: He has a normal mood and affect. His behavior is normal. Judgment and thought content normal.        Assessment & Plan:     1.  Hematuria, unspecified type Treat with Septra as below. Will send urine for C/S. F/U in 1 week to recheck urine.  - POCT urinalysis dipstick - Urine culture - POCT UA - Microscopic Only - sulfamethoxazole-trimethoprim (BACTRIM) 400-80 MG tablet; Take 1 tablet by mouth 2 (two) times daily.  Dispense: 14 tablet; Refill: 0 2.Chronic anticoagulation for PE May have to stop coumadin at 83 years old.spoke with [facility] ,who will bring in pt next week. 3.[facility]CI      Amirrah Quigley Cranford Mon, MD  [facility]

## 2016-11-14 LAB — URINE CULTURE

## 2016-11-19 ENCOUNTER — Encounter: Payer: Self-pay | Admitting: Family Medicine

## 2016-11-19 ENCOUNTER — Ambulatory Visit (INDEPENDENT_AMBULATORY_CARE_PROVIDER_SITE_OTHER): Payer: Medicare Other | Admitting: Family Medicine

## 2016-11-19 VITALS — BP 116/62 | HR 84 | Temp 97.7°F | Resp 16 | Wt 171.0 lb

## 2016-11-19 DIAGNOSIS — I482 Chronic atrial fibrillation, unspecified: Secondary | ICD-10-CM

## 2016-11-19 DIAGNOSIS — R413 Other amnesia: Secondary | ICD-10-CM

## 2016-11-19 DIAGNOSIS — R3129 Other microscopic hematuria: Secondary | ICD-10-CM

## 2016-11-19 DIAGNOSIS — N3001 Acute cystitis with hematuria: Secondary | ICD-10-CM | POA: Diagnosis not present

## 2016-11-19 LAB — POCT URINALYSIS DIPSTICK
BILIRUBIN UA: NEGATIVE
GLUCOSE UA: NEGATIVE
KETONES UA: NEGATIVE
Nitrite, UA: NEGATIVE
SPEC GRAV UA: 1.02 (ref 1.010–1.025)
Urobilinogen, UA: 0.2 E.U./dL
pH, UA: 7 (ref 5.0–8.0)

## 2016-11-19 LAB — POCT INR
INR: 1.7
PT: 21

## 2016-11-19 MED ORDER — MEMANTINE HCL 5 MG PO TABS: 5.0000 mg | ORAL_TABLET | Freq: Two times a day (BID) | ORAL | 3 refills | Status: DC

## 2016-11-19 NOTE — Progress Notes (Signed)
Patient: Tyler Kirk Male    DOB: Nov 01, 1919   83 y.o.   MRN: [id number] Visit Date: 11/19/2016  Today's Provider: Wilhemena Durie, MD   Chief Complaint  Patient presents with  . Hematuria  . Anticoagulation   Subjective:    HPI     Follow up for Hematuria  The patient was last seen for this 1 weeks ago. Changes made at last visit include adding Bactrim. Urine cx was positive for morganella.  He reports good compliance with treatment. He feels that condition is Improved. He is not having side effects.  Results for orders placed or performed in visit on 11/19/16  POCT INR  Result Value Ref Range   INR 1.7    PT 21.0   POCT urinalysis dipstick  Result Value Ref Range   Color, UA yellow    Clarity, UA cloudy    Glucose, UA negative    Bilirubin, UA negative    Ketones, UA negative    Spec Grav, UA 1.020 1.010 - 1.025   Blood, UA NH Moderate    pH, UA 7.0 5.0 - 8.0   Protein, UA trace    Urobilinogen, UA 0.2 0.2 or 1.0 E.U./dL   Nitrite, UA negative    Leukocytes, UA Trace (A) Negative     ------------------------------------------------------------------------------------  Anticoagulation: Patient here for anticoagulation monitoring. Indication: atrial fibrillation Bleeding Signs/Symptoms:  None Thromboembolic Signs/Symptoms:  None  Missed Coumadin Doses:  None Medication Changes:  yes - adding Bactrim Dietary Changes:  no Bacterial/Viral Infection:  no  Other Concerns: pt is 83 YO, and may need to D/C coumadin.    No Known Allergies   Current Outpatient Prescriptions:  .  aspirin EC 81 MG tablet, Take 1 tablet on M/W/F, Disp: , Rfl:  .  fluticasone (FLONASE) 50 MCG/ACT nasal spray, Place into the nose., Disp: , Rfl:  .  glucose blood (ONE TOUCH ULTRA TEST) test strip, 1 each by Other route every morning. Use as instructed Dx:E11.9, Disp: 100 each, Rfl: 12 .  metFORMIN (GLUCOPHAGE) 500 MG tablet, TAKE ONE TABLET BY MOUTH ONCE DAILY,  Disp: 90 tablet, Rfl: 3 .  MULTIPLE VITAMIN PO, Take by mouth., Disp: , Rfl:  .  tamsulosin (FLOMAX) 0.4 MG CAPS capsule, TAKE ONE CAPSULE BY MOUTH ONCE DAILY, Disp: 90 capsule, Rfl: 3 .  warfarin (COUMADIN) 2 MG tablet, Take 1 tablet (2 mg total) by mouth daily., Disp: 90 tablet, Rfl: 3 .  warfarin (COUMADIN) 3 MG tablet, Take 1 tablet (3 mg total) by mouth daily. (Patient not taking: Reported on 11/19/2016), Disp: 30 tablet, Rfl: 12 .  warfarin (COUMADIN) 5 MG tablet, Take by mouth., Disp: , Rfl:   Review of Systems  Constitutional: Negative for activity change, appetite change, chills, diaphoresis, fatigue, fever and unexpected weight change.  HENT: Negative.   Eyes: Negative.   Respiratory: Negative.   Cardiovascular: Negative.   Gastrointestinal: Negative.   Endocrine: Negative.   Genitourinary: Positive for frequency (Flomax). Negative for dysuria and hematuria.       Chronic incontinence.  Allergic/Immunologic: Negative.   Neurological: Negative.   Hematological: Bruises/bleeds easily.  Psychiatric/Behavioral: Negative.     Social History  Substance Use Topics  . Smoking status: Never Smoker  . Smokeless tobacco: Never Used  . Alcohol use No   Objective:   BP 116/62 (BP Location: Left Arm, Patient Position: Sitting, Cuff Size: Normal)   Pulse 84   Temp 97.7 F (36.5 C) (  Oral)   Resp 16   Wt 171 lb (77.6 kg)   BMI 27.60 kg/m  Vitals:   11/19/16 1128  BP: 116/62  Pulse: 84  Resp: 16  Temp: 97.7 F (36.5 C)  TempSrc: Oral  Weight: 171 lb (77.6 kg)     Physical Exam  Constitutional: He is oriented to person, place, and time. He appears well-developed and well-nourished.  HENT:  Head: Normocephalic and atraumatic.  Right Ear: External ear normal.  Left Ear: External ear normal.  Nose: Nose normal.  Eyes: Conjunctivae are normal.  Neck: Normal range of motion. No thyromegaly present.  Cardiovascular: Normal rate, regular rhythm and normal heart sounds.     Pulmonary/Chest: Effort normal and breath sounds normal. No respiratory distress.  Abdominal: Soft.  Neurological: He is alert and oriented to person, place, and time.  Skin: Skin is warm and dry.  Psychiatric: He has a normal mood and affect. His behavior is normal.        Assessment & Plan:     1. Chronic atrial fibrillation (HCC) Continue same dose and recheck at FU in June. - POCT INR  2. Acute cystitis with hematuria Resend for cx. May need to restart abx. - POCT urinalysis dipstick  3. Microscopic hematuria Has been in dip urine results in the past. Declines urology referral. May need to stop coumadin in future. 4. Memory loss Stable. Will start medication to prevent this from progressing. - memantine (NAMENDA) 5 MG tablet; Take 1 tablet (5 mg total) by mouth 2 (two) times daily.  Dispense: 180 tablet; Refill: 3  MMSE - Mini Mental State Exam 11/19/2016  Orientation to time 5  Orientation to Place 5  Registration 2  Attention/ Calculation 5  Recall 2  Language- name 2 objects 2  Language- repeat 1  Language- follow 3 step command 3  Language- read & follow direction 1  Write a sentence 1  Copy design 1  Total score 28   5.Recurrent PE Lifelong coumadin as long as clincically appropriate.     I have done the exam and reviewed the above chart and it is accurate to the best of my knowledge. Development worker, community has been used in this note in any air is in the dictation or transcription are unintentional.  Wilhemena Durie, MD  [place]

## 2016-11-20 ENCOUNTER — Ambulatory Visit: Payer: Self-pay

## 2016-11-20 LAB — URINE CULTURE: ORGANISM ID, BACTERIA: NO GROWTH

## 2016-12-09 ENCOUNTER — Other Ambulatory Visit: Payer: Self-pay | Admitting: Family Medicine

## 2016-12-09 DIAGNOSIS — E118 Type 2 diabetes mellitus with unspecified complications: Secondary | ICD-10-CM

## 2016-12-09 MED ORDER — GLUCOSE BLOOD VI STRP: 1.0000 | ORAL_STRIP | 12 refills | Status: AC

## 2016-12-09 NOTE — Telephone Encounter (Signed)
Pt contacted office for refill request on the following medications:  glucose blood (ONE TOUCH ULTRA TEST) test strip.  [place]  CB#[phone]/MW

## 2016-12-09 NOTE — Telephone Encounter (Signed)
Done

## 2016-12-16 ENCOUNTER — Ambulatory Visit: Payer: Self-pay | Admitting: Emergency Medicine

## 2016-12-16 ENCOUNTER — Ambulatory Visit (INDEPENDENT_AMBULATORY_CARE_PROVIDER_SITE_OTHER): Payer: Medicare Other | Admitting: Family Medicine

## 2016-12-16 DIAGNOSIS — I482 Chronic atrial fibrillation, unspecified: Secondary | ICD-10-CM

## 2016-12-16 LAB — POCT INR
INR: 1.8
PT: 21.5

## 2016-12-16 NOTE — Patient Instructions (Signed)
Anticoagulation Warfarin Dose Instructions as of 12/16/2016      Tyler Kirk Tue Wed Thu Fri Sat   New Dose 2 mg 2 mg 2 mg 2 mg 2 mg 2 mg 2 mg    Description   2 mg daily.Continue same dose and will recheck next week at follow up appt.

## 2016-12-17 NOTE — Progress Notes (Signed)
PT/INR only 

## 2016-12-23 ENCOUNTER — Ambulatory Visit: Payer: Self-pay | Admitting: Family Medicine

## 2016-12-26 ENCOUNTER — Ambulatory Visit (INDEPENDENT_AMBULATORY_CARE_PROVIDER_SITE_OTHER): Payer: Medicare Other | Admitting: Family Medicine

## 2016-12-26 ENCOUNTER — Encounter: Payer: Self-pay | Admitting: Family Medicine

## 2016-12-26 ENCOUNTER — Ambulatory Visit (INDEPENDENT_AMBULATORY_CARE_PROVIDER_SITE_OTHER): Payer: Medicare Other

## 2016-12-26 VITALS — BP 146/72 | HR 76 | Temp 99.0°F | Resp 16 | Wt 171.8 lb

## 2016-12-26 VITALS — BP 146/72 | HR 76 | Temp 99.0°F | Ht 66.0 in | Wt 171.8 lb

## 2016-12-26 DIAGNOSIS — R413 Other amnesia: Secondary | ICD-10-CM | POA: Diagnosis not present

## 2016-12-26 DIAGNOSIS — L308 Other specified dermatitis: Secondary | ICD-10-CM | POA: Diagnosis not present

## 2016-12-26 DIAGNOSIS — Z0000 Encounter for general adult medical examination without abnormal findings: Secondary | ICD-10-CM

## 2016-12-26 DIAGNOSIS — R5383 Other fatigue: Secondary | ICD-10-CM | POA: Diagnosis not present

## 2016-12-26 DIAGNOSIS — I2782 Chronic pulmonary embolism: Secondary | ICD-10-CM | POA: Diagnosis not present

## 2016-12-26 DIAGNOSIS — E118 Type 2 diabetes mellitus with unspecified complications: Secondary | ICD-10-CM | POA: Diagnosis not present

## 2016-12-26 DIAGNOSIS — I482 Chronic atrial fibrillation, unspecified: Secondary | ICD-10-CM

## 2016-12-26 LAB — POCT INR
INR: 2.4
PT: 28.3

## 2016-12-26 LAB — POCT GLYCOSYLATED HEMOGLOBIN (HGB A1C): Hemoglobin A1C: 7

## 2016-12-26 MED ORDER — MOMETASONE FUROATE 0.1 % EX CREA: 1.0000 "application " | TOPICAL_CREAM | Freq: Every day | CUTANEOUS | 0 refills | Status: DC

## 2016-12-26 NOTE — Progress Notes (Signed)
Subjective:  HPI Pt is here today for a follow up for memory loss. He was started on Namenda on 11/19/16. Daughter and pt doe not see a difference in his memory. He says he is not as stable on his feet as he used to be.    He also needs to have his PT/INR rechecked today. His las INR was 1.8 on 12/16/16. He was about to have a dental procedure and needed it check. He was to continue the same dose. (2 mg daily). Today INR was 2.4 and PT was 28.3. He is still taking 2 mg daily.    Diabetes Mellitus Type II, Follow-up:   Lab Results  Component Value Date   HGBA1C 7.1 07/02/2016   HGBA1C 7.3 12/28/2015   HGBA1C 7.8 (H) 08/24/2015    Last seen for diabetes 5 months ago.  Management since then includes none. He reports good compliance with treatment. Home blood sugar records: 100-150. mostly around 130.  Pertinent Labs:    Component Value Date/Time   CHOL 146 08/24/2015 1532   CHOL 128 10/13/2013 0814   TRIG 234 (H) 08/24/2015 1532   TRIG 159 10/13/2013 0814   HDL 51 08/24/2015 1532   HDL 45 10/13/2013 0814   LDLCALC 48 08/24/2015 1532   LDLCALC 51 10/13/2013 0814   CREATININE 0.88 01/29/2016 1228   CREATININE 1.00 10/13/2013 0814    Wt Readings from Last 3 Encounters:  12/26/16 171 lb 12.8 oz (77.9 kg)  12/26/16 171 lb 12.8 oz (77.9 kg)  11/19/16 171 lb (77.6 kg)    ------------------------------------------------------------------------    Prior to Admission medications   Medication Sig Start Date End Date Taking? Authorizing Provider  aspirin EC 81 MG tablet Take 1 tablet on M/W/F 01/29/16  Yes Jerrol Banana., MD  fluticasone [facility]) 50 MCG/ACT nasal spray Place into the nose.  06/07/14  Yes [provider]  glucose blood (ONE TOUCH ULTRA TEST) test strip 1 each by Other route every morning. Use as instructed Dx:E11.9 12/09/16  Yes Jerrol Banana., MD  memantine [facility]) 5 MG tablet Take 1 tablet (5 mg total) by mouth 2 (two) times daily.  11/19/16  Yes Jerrol Banana., MD  metFORMIN (GLUCOPHAGE) 500 MG tablet TAKE ONE TABLET BY MOUTH ONCE DAILY 10/18/16  Yes Jerrol Banana., MD  MULTIPLE VITAMIN PO Take by mouth. 08/01/10  Yes [provider]  tamsulosin (FLOMAX) 0.4 MG CAPS capsule TAKE ONE CAPSULE BY MOUTH ONCE DAILY 10/18/16  Yes Jerrol Banana., MD  warfarin (COUMADIN) 2 MG tablet Take 1 tablet (2 mg total) by mouth daily. 04/17/16  Yes Jerrol Banana., MD  warfarin (COUMADIN) 3 MG tablet Take 1 tablet (3 mg total) by mouth daily. 12/06/14  Yes Jerrol Banana., MD  warfarin (COUMADIN) 5 MG tablet Take by mouth. 08/23/11  Yes [provider]    Patient Active Problem List   Diagnosis Date Noted  . Microscopic hematuria 11/19/2016  . Absolute anemia 12/01/2014  . Arthritis 12/01/2014  . A-fib ([place]) 12/01/2014  . Benign fibroma of prostate 12/01/2014  . Cataract 12/01/2014  . Clinical depression 12/01/2014  . Esophagitis, reflux 12/01/2014  . Fatigue 12/01/2014  . Bilateral hearing loss 12/01/2014  . Hyperplasia, prostate 12/01/2014  . Memory loss 12/01/2014  . PE (pulmonary embolism) 12/01/2014  . Basal cell papilloma 12/01/2014  . Dermatologic disease 10/17/2009  . Hypercholesterolemia without hypertriglyceridemia 05/11/2009  . Hernia, inguinal, unilateral 05/09/2006  .  BP (high blood pressure) 03/28/2006  . HLD (hyperlipidemia) 03/28/2006  . Diabetes mellitus, type 2 ([place]) 03/28/2006    Past Medical History:  Diagnosis Date  . Afib ([place])   . Arthritis   . BPH (benign prostatic hypertrophy)   . Depression   . Diabetes mellitus without complication ([place])   . GERD (gastroesophageal reflux disease)   . Hernia, inguinal    Chronic Large Inguinal Hernia  . Hyperlipidemia   . Hypertension   . Pulmonary embolism ([place])   . UTI (urinary tract infection)     Social History   Social History  . Marital status: Widowed    Spouse name: N/A  . Number of children:  N/A  . Years of education: N/A   Occupational History  . Not on file.   Social History Main Topics  . Smoking status: Never Smoker  . Smokeless tobacco: Never Used  . Alcohol use No  . Drug use: No  . Sexual activity: Not on file   Other Topics Concern  . Not on file   Social History Narrative  . No narrative on file    No Known Allergies  Review of Systems  Constitutional: Negative.   HENT: Negative.   Eyes: Negative.   Respiratory: Negative.   Cardiovascular: Negative.   Gastrointestinal: Negative.   Genitourinary: Negative.   Musculoskeletal: Negative.   Skin: Negative.   Neurological: Negative.        Slowly rpogressive unsteady gait.  Endo/Heme/Allergies: Negative.   Psychiatric/Behavioral: Positive for memory loss.    Immunization History  Administered Date(s) Administered  . Influenza, High Dose Seasonal PF 03/16/2015, 03/22/2016  . Pneumococcal Conjugate-13 06/07/2014  . Pneumococcal Polysaccharide-23 03/24/2012  . Td 08/02/2010    Objective:  BP (!) 146/72   Pulse 76   Temp 99 F (37.2 C) (Oral)   Resp 16   Wt 171 lb 12.8 oz (77.9 kg)   BMI 27.73 kg/m   Physical Exam  Constitutional: He is oriented to person, place, and time and well-developed, well-nourished, and in no distress.  HENT:  Head: Normocephalic and atraumatic.  Right Ear: External ear normal.  Left Ear: External ear normal.  Nose: Nose normal.  Eyes: Conjunctivae are normal. No scleral icterus.  Cardiovascular: Normal rate, regular rhythm and normal heart sounds.   Pulmonary/Chest: Effort normal and breath sounds normal.  Abdominal: Soft.  Neurological: He is alert and oriented to person, place, and time.  Mildly unsteady gait.  Skin: Skin is warm and dry.  Fair ,dry skin.  Psychiatric: Mood, memory, affect and judgment normal.    Lab Results  Component Value Date   WBC 7.6 01/29/2016   HGB 15.2 01/29/2016   HCT 45.4 01/29/2016   PLT 225 01/29/2016   GLUCOSE 119 (H)  01/29/2016   CHOL 146 08/24/2015   TRIG 234 (H) 08/24/2015   HDL 51 08/24/2015   LDLCALC 48 08/24/2015   TSH 3.410 01/29/2016   INR 1.8 12/16/2016   HGBA1C 7.1 07/02/2016    CMP     Component Value Date/Time   NA 143 01/29/2016 1228   NA 140 10/13/2013 0814   K 4.9 01/29/2016 1228   K 4.6 10/13/2013 0814   CL 102 01/29/2016 1228   CL 105 10/13/2013 0814   CO2 25 01/29/2016 1228   CO2 31 10/13/2013 0814   GLUCOSE 119 (H) 01/29/2016 1228   GLUCOSE 152 (H) 10/13/2013 0814   BUN 22 01/29/2016 1228   BUN 23 (H) 10/13/2013 9767  CREATININE 0.88 01/29/2016 1228   CREATININE 1.00 10/13/2013 0814   CALCIUM 9.8 01/29/2016 1228   CALCIUM 9.1 10/13/2013 0814   PROT 6.5 08/24/2015 1532   PROT 6.9 10/13/2013 0814   ALBUMIN 4.1 01/29/2016 1228   ALBUMIN 3.4 10/13/2013 0814   AST 28 08/24/2015 1532   AST 24 10/13/2013 0814   ALT 27 08/24/2015 1532   ALT 28 10/13/2013 0814   ALKPHOS 49 08/24/2015 1532   ALKPHOS 52 10/13/2013 0814   BILITOT 0.3 08/24/2015 1532   BILITOT 0.5 10/13/2013 0814   GFRNONAA 74 01/29/2016 1228   GFRNONAA >60 10/13/2013 0814   GFRAA 86 01/29/2016 1228   GFRAA >60 10/13/2013 0814    Assessment and Plan :  1. Chronic atrial fibrillation (HCC)  - POCT INR  2. Type 2 diabetes mellitus with complication, unspecified whether long term insulin use (HCC)  - POCT HgB A1C - Comprehensive metabolic panel  3. Other fatigue  - EKG 12-Lead - CBC with Differential/Platelet - TSH  4. Other eczema  - mometasone (ELOCON) 0.1 % cream; Apply 1 application topically daily. For 1 to 2 weeks only  Dispense: 60 g; Refill: 0 5.[facility] More than 50% of visit spent in counselling.  I have done the exam and reviewed the above chart and it is accurate to the best of my knowledge. Development worker, community has been used in this note in any air is in the dictation or transcription are unintentional.  [place]  [facility] 12/26/2016 3:29 PM

## 2016-12-26 NOTE — Patient Instructions (Signed)
[name] , Thank you for taking time to come for your Medicare Wellness Visit. I appreciate your ongoing commitment to your health goals. Please review the following plan we discussed and let me know if I can assist you in the future.   Screening recommendations/referrals: Colonoscopy: N/A Recommended yearly ophthalmology/optometry visit for glaucoma screening and checkup Recommended yearly dental visit for hygiene and checkup  Vaccinations: Influenza vaccine: due 03/2017 Pneumococcal vaccine: completed series Tdap vaccine: completed 08/02/10, due 08/2020 Shingles vaccine: declined  Advanced directives: Please bring a copy of your POA (Power of Attorney) and/or Living Will to your next appointment.   Conditions/risks identified: Recommend increasing water intake to 2-4 glasses of water a day.  Next appointment: None, need to schedule 1 year AWV  Preventive Care 81 Years and Older, Male Preventive care refers to lifestyle choices and visits with your health care provider that can promote health and wellness. What does preventive care include?  A yearly physical exam. This is also called an annual well check.  Dental exams once or twice a year.  Routine eye exams. Ask your health care provider how often you should have your eyes checked.  Personal lifestyle choices, including:  Daily care of your teeth and gums.  Regular physical activity.  Eating a healthy diet.  Avoiding tobacco and drug use.  Limiting alcohol use.  Practicing safe sex.  Taking low doses of aspirin every day.  Taking vitamin and mineral supplements as recommended by your health care provider. What happens during an annual well check? The services and screenings done by your health care provider during your annual well check will depend on your age, overall health, lifestyle risk factors, and family history of disease. Counseling  Your health care provider may ask you questions about your:  Alcohol  use.  Tobacco use.  Drug use.  Emotional well-being.  Home and relationship well-being.  Sexual activity.  Eating habits.  History of falls.  Memory and ability to understand (cognition).  Work and work Statistician. Screening  You may have the following tests or measurements:  Height, weight, and BMI.  Blood pressure.  Lipid and cholesterol levels. These may be checked every 5 years, or more frequently if you are over 94 years old.  Skin check.  Lung cancer screening. You may have this screening every year starting at age 81 if you have a 30-pack-year history of smoking and currently smoke or have quit within the past 15 years.  Fecal occult blood test (FOBT) of the stool. You may have this test every year starting at age 81.  Flexible sigmoidoscopy or colonoscopy. You may have a sigmoidoscopy every 5 years or a colonoscopy every 10 years starting at age 81.  Prostate cancer screening. Recommendations will vary depending on your family history and other risks.  Hepatitis C blood test.  Hepatitis B blood test.  Sexually transmitted disease (STD) testing.  Diabetes screening. This is done by checking your blood sugar (glucose) after you have not eaten for a while (fasting). You may have this done every 1-3 years.  Abdominal aortic aneurysm (AAA) screening. You may need this if you are a current or former smoker.  Osteoporosis. You may be screened starting at age 81 if you are at high risk. Talk with your health care provider about your test results, treatment options, and if necessary, the need for more tests. Vaccines  Your health care provider may recommend certain vaccines, such as:  Influenza vaccine. This is recommended every year.  Tetanus, diphtheria, and acellular pertussis (Tdap, Td) vaccine. You may need a Td booster every 10 years.  Zoster vaccine. You may need this after age 81.  Pneumococcal 13-valent conjugate (PCV13) vaccine. One dose is  recommended after age 15.  Pneumococcal polysaccharide (PPSV23) vaccine. One dose is recommended after age 81. Talk to your health care provider about which screenings and vaccines you need and how often you need them. This information is not intended to replace advice given to you by your health care provider. Make sure you discuss any questions you have with your health care provider. Document Released: 07/14/2015 Document Revised: 03/06/2016 Document Reviewed: 04/18/2015 Elsevier Interactive Patient Education  2017 [place] Prevention in the Home Falls can cause injuries. They can happen to people of all ages. There are many things you can do to make your home safe and to help prevent falls. What can I do on the outside of my home?  Regularly fix the edges of walkways and driveways and fix any cracks.  Remove anything that might make you trip as you walk through a door, such as a raised step or threshold.  Trim any bushes or trees on the path to your home.  Use bright outdoor lighting.  Clear any walking paths of anything that might make someone trip, such as rocks or tools.  Regularly check to see if handrails are loose or broken. Make sure that both sides of any steps have handrails.  Any raised decks and porches should have guardrails on the edges.  Have any leaves, snow, or ice cleared regularly.  Use sand or salt on walking paths during winter.  Clean up any spills in your garage right away. This includes oil or grease spills. What can I do in the bathroom?  Use night lights.  Install grab bars by the toilet and in the tub and shower. Do not use towel bars as grab bars.  Use non-skid mats or decals in the tub or shower.  If you need to sit down in the shower, use a plastic, non-slip stool.  Keep the floor dry. Clean up any water that spills on the floor as soon as it happens.  Remove soap buildup in the tub or shower regularly.  Attach bath mats  securely with double-sided non-slip rug tape.  Do not have throw rugs and other things on the floor that can make you trip. What can I do in the bedroom?  Use night lights.  Make sure that you have a light by your bed that is easy to reach.  Do not use any sheets or blankets that are too big for your bed. They should not hang down onto the floor.  Have a firm chair that has side arms. You can use this for support while you get dressed.  Do not have throw rugs and other things on the floor that can make you trip. What can I do in the kitchen?  Clean up any spills right away.  Avoid walking on wet floors.  Keep items that you use a lot in easy-to-reach places.  If you need to reach something above you, use a strong step stool that has a grab bar.  Keep electrical cords out of the way.  Do not use floor polish or wax that makes floors slippery. If you must use wax, use non-skid floor wax.  Do not have throw rugs and other things on the floor that can make you trip. What can I do  with my stairs?  Do not leave any items on the stairs.  Make sure that there are handrails on both sides of the stairs and use them. Fix handrails that are broken or loose. Make sure that handrails are as long as the stairways.  Check any carpeting to make sure that it is firmly attached to the stairs. Fix any carpet that is loose or worn.  Avoid having throw rugs at the top or bottom of the stairs. If you do have throw rugs, attach them to the floor with carpet tape.  Make sure that you have a light switch at the top of the stairs and the bottom of the stairs. If you do not have them, ask someone to add them for you. What else can I do to help prevent falls?  Wear shoes that:  Do not have high heels.  Have rubber bottoms.  Are comfortable and fit you well.  Are closed at the toe. Do not wear sandals.  If you use a stepladder:  Make sure that it is fully opened. Do not climb a closed  stepladder.  Make sure that both sides of the stepladder are locked into place.  Ask someone to hold it for you, if possible.  Clearly mark and make sure that you can see:  Any grab bars or handrails.  First and last steps.  Where the edge of each step is.  Use tools that help you move around (mobility aids) if they are needed. These include:  Canes.  Walkers.  Scooters.  Crutches.  Turn on the lights when you go into a dark area. Replace any light bulbs as soon as they burn out.  Set up your furniture so you have a clear path. Avoid moving your furniture around.  If any of your floors are uneven, fix them.  If there are any pets around you, be aware of where they are.  Review your medicines with your doctor. Some medicines can make you feel dizzy. This can increase your chance of falling. Ask your doctor what other things that you can do to help prevent falls. This information is not intended to replace advice given to you by your health care provider. Make sure you discuss any questions you have with your health care provider. Document Released: 04/13/2009 Document Revised: 11/23/2015 Document Reviewed: 07/22/2014 Elsevier Interactive Patient Education  2017 [employer].

## 2016-12-26 NOTE — Progress Notes (Signed)
Subjective:   Tyler Kirk is a 83 y.o. male who presents for Medicare Annual/Subsequent preventive examination.  Review of Systems:  N/A  Cardiac Risk Factors include: advanced age (>65men, >95 women);diabetes mellitus;dyslipidemia;hypertension;male gender     Objective:    Vitals: BP (!) 146/72 (BP Location: Right Arm)   Pulse 76   Temp 99 F (37.2 C) (Oral)   Ht 5\' 6"  (1.676 m)   Wt 171 lb 12.8 oz (77.9 kg)   BMI 27.73 kg/m   Body mass index is 27.73 kg/m.  Tobacco History  Smoking Status  . Never Smoker  Smokeless Tobacco  . Never Used     Counseling given: Not Answered   Past Medical History:  Diagnosis Date  . Afib ([place])   . Arthritis   . BPH (benign prostatic hypertrophy)   . Depression   . Diabetes mellitus without complication ([place])   . GERD (gastroesophageal reflux disease)   . Hernia, inguinal    Chronic Large Inguinal Hernia  . Hyperlipidemia   . Hypertension   . Pulmonary embolism ([place])   . UTI (urinary tract infection)    Past Surgical History:  Procedure Laterality Date  . EYE SURGERY     catarct  . PROSTATE SURGERY     prostate removed  . skin cancer: Removed  Left    Left arm near wrist area  . TOOTH EXTRACTION     Family History  Problem Relation Age of Onset  . Stroke Mother   . Hypertension Mother   . Stroke Father    History  Sexual Activity  . Sexual activity: Not on file    Outpatient Encounter Prescriptions as of 12/26/2016  Medication Sig  . aspirin EC 81 MG tablet Take 1 tablet on M/W/F  . glucose blood (ONE TOUCH ULTRA TEST) test strip 1 each by Other route every morning. Use as instructed Dx:E11.9  . memantine (NAMENDA) 5 MG tablet Take 1 tablet (5 mg total) by mouth 2 (two) times daily.  . metFORMIN (GLUCOPHAGE) 500 MG tablet TAKE ONE TABLET BY MOUTH ONCE DAILY  . MULTIPLE VITAMIN PO Take by mouth.  . tamsulosin (FLOMAX) 0.4 MG CAPS capsule TAKE ONE CAPSULE BY MOUTH ONCE DAILY  . warfarin (COUMADIN) 2 MG  tablet Take 1 tablet (2 mg total) by mouth daily.  . fluticasone (FLONASE) 50 MCG/ACT nasal spray Place into the nose.   . warfarin (COUMADIN) 3 MG tablet Take 1 tablet (3 mg total) by mouth daily. (Patient not taking: Reported on 11/19/2016)  . warfarin (COUMADIN) 5 MG tablet Take by mouth.   No facility-administered encounter medications on file as of 12/26/2016.     Activities of Daily Living In your present state of health, do you have any difficulty performing the following activities: 12/26/2016 12/28/2015  Hearing? Tempie Donning  Vision? Y N  Difficulty concentrating or making decisions? Tempie Donning  Walking or climbing stairs? N N  Dressing or bathing? N N  Doing errands, shopping? N Y  Conservation officer, nature and eating ? N -  Using the Toilet? N -  In the past six months, have you accidently leaked urine? Y -  Do you have problems with loss of bowel control? N -  Managing your Medications? N -  Managing your Finances? N -  Housekeeping or managing your Housekeeping? Y -  Some recent data might be hidden    Patient Care Team: Jerrol Banana., MD as PCP - General (Family Medicine)  Assessment:     Exercise Activities and Dietary recommendations Current Exercise Habits: Home exercise routine, Type of exercise: walking, Time (Minutes): 20 (0.5 mile), Intensity: Mild  Goals    . Increase water intake          Recommend increasing water intake to 2-4 glasses of water a day.      Fall Risk Fall Risk  12/26/2016 11/19/2016 12/28/2015 08/24/2015 08/24/2015  Falls in the past year? No No No No No  Injury with Fall? - - - - -   Depression Screen PHQ 2/9 Scores 12/26/2016 11/19/2016 08/24/2015 03/16/2015  PHQ - 2 Score 1 1 0 1  PHQ- 9 Score 7 3 - -    Cognitive Function MMSE - Mini Mental State Exam 11/19/2016  Orientation to time 5  Orientation to Place 5  Registration 2  Attention/ Calculation 5  Recall 2  Language- name 2 objects 2  Language- repeat 1  Language- follow 3 step command 3    Language- read & follow direction 1  Write a sentence 1  Copy design 1  Total score 28     6CIT Screen 12/26/2016 04/23/2016  What Year? 0 points 0 points  What month? 0 points 0 points  What time? 0 points 3 points  Count back from 20 0 points 4 points  Months in reverse 2 points 0 points  Repeat phrase 0 points 10 points  Total Score 2 17    Immunization History  Administered Date(s) Administered  . Influenza, High Dose Seasonal PF 03/16/2015, 03/22/2016  . Pneumococcal Conjugate-13 06/07/2014  . Pneumococcal Polysaccharide-23 03/24/2012  . Td 08/02/2010   Screening Tests Health Maintenance  Topic Date Due  . URINE MICROALBUMIN  05/20/1930  . FOOT EXAM  12/27/2016  . HEMOGLOBIN A1C  12/30/2016  . INFLUENZA VACCINE  01/29/2017  . OPHTHALMOLOGY EXAM  10/22/2017  . TETANUS/TDAP  08/02/2020  . PNA vac Low Risk Adult  Completed      Plan:  I have personally reviewed and addressed the Medicare Annual Wellness questionnaire and have noted the following in the patient's chart:  A. Medical and social history B. Use of alcohol, tobacco or illicit drugs  C. Current medications and supplements D. Functional ability and status E.  Nutritional status F.  Physical activity G. Advance directives H. List of other physicians I.  Hospitalizations, surgeries, and ER visits in previous 12 months J.  [place] such as hearing and vision if needed, cognitive and depression L. Referrals and appointments - none  In addition, I have reviewed and discussed with patient certain preventive protocols, quality metrics, and best practice recommendations. A written personalized care plan for preventive services as well as general preventive health recommendations were provided to patient.  See attached scanned questionnaire for additional information.   Signed,  Fabio Neighbors, LPN Nurse Health Advisor   MD Recommendations: Needs urine microalbumin screening today.

## 2016-12-27 ENCOUNTER — Telehealth: Payer: Self-pay | Admitting: Family Medicine

## 2016-12-27 LAB — CBC WITH DIFFERENTIAL/PLATELET
BASOS ABS: 0.1 10*3/uL (ref 0.0–0.2)
Basos: 1 %
EOS (ABSOLUTE): 0.5 10*3/uL — AB (ref 0.0–0.4)
Eos: 8 %
HEMOGLOBIN: 15.3 g/dL (ref 13.0–17.7)
Hematocrit: 46.5 % (ref 37.5–51.0)
IMMATURE GRANS (ABS): 0 10*3/uL (ref 0.0–0.1)
IMMATURE GRANULOCYTES: 1 %
LYMPHS: 27 %
Lymphocytes Absolute: 1.8 10*3/uL (ref 0.7–3.1)
MCH: 29.2 pg (ref 26.6–33.0)
MCHC: 32.9 g/dL (ref 31.5–35.7)
MCV: 89 fL (ref 79–97)
MONOCYTES: 14 %
Monocytes Absolute: 0.9 10*3/uL (ref 0.1–0.9)
NEUTROS PCT: 49 %
Neutrophils Absolute: 3.3 10*3/uL (ref 1.4–7.0)
PLATELETS: 235 10*3/uL (ref 150–379)
RBC: 5.24 x10E6/uL (ref 4.14–5.80)
RDW: 14 % (ref 12.3–15.4)
WBC: 6.6 10*3/uL (ref 3.4–10.8)

## 2016-12-27 LAB — COMPREHENSIVE METABOLIC PANEL
ALT: 20 IU/L (ref 0–44)
AST: 19 IU/L (ref 0–40)
Albumin/Globulin Ratio: 1.6 (ref 1.2–2.2)
Albumin: 4.2 g/dL (ref 3.2–4.6)
Alkaline Phosphatase: 56 IU/L (ref 39–117)
BUN/Creatinine Ratio: 27 — ABNORMAL HIGH (ref 10–24)
BUN: 26 mg/dL (ref 10–36)
Bilirubin Total: 0.3 mg/dL (ref 0.0–1.2)
CALCIUM: 9.6 mg/dL (ref 8.6–10.2)
CO2: 24 mmol/L (ref 20–29)
CREATININE: 0.96 mg/dL (ref 0.76–1.27)
Chloride: 101 mmol/L (ref 96–106)
GFR calc Af Amer: 78 mL/min/{1.73_m2} (ref >59)
GFR, EST NON AFRICAN AMERICAN: 67 mL/min/{1.73_m2} (ref >59)
GLUCOSE: 134 mg/dL — AB (ref 65–99)
Globulin, Total: 2.7 g/dL (ref 1.5–4.5)
POTASSIUM: 4.5 mmol/L (ref 3.5–5.2)
Sodium: 142 mmol/L (ref 134–144)
TOTAL PROTEIN: 6.9 g/dL (ref 6.0–8.5)

## 2016-12-27 LAB — TSH: TSH: 3.55 u[IU]/mL (ref 0.450–4.500)

## 2016-12-27 NOTE — Telephone Encounter (Signed)
Pt returning you call regarding lab results.

## 2017-01-22 ENCOUNTER — Other Ambulatory Visit: Payer: Self-pay | Admitting: Emergency Medicine

## 2017-01-22 DIAGNOSIS — G3184 Mild cognitive impairment, so stated: Secondary | ICD-10-CM

## 2017-01-24 ENCOUNTER — Ambulatory Visit (INDEPENDENT_AMBULATORY_CARE_PROVIDER_SITE_OTHER): Payer: Medicare Other

## 2017-01-24 DIAGNOSIS — I482 Chronic atrial fibrillation, unspecified: Secondary | ICD-10-CM

## 2017-01-24 LAB — POCT INR
INR: 2.2
PT: 26.7

## 2017-01-24 NOTE — Patient Instructions (Signed)
Anticoagulation Warfarin Dose Instructions as of 01/24/2017      Dorene Grebe Tue Wed Thu Fri Sat   New Dose 2 mg 2 mg 2 mg 2 mg 2 mg 2 mg 2 mg    Description   Continue 2 mg daily F/U 4 weeks

## 2017-02-13 ENCOUNTER — Ambulatory Visit (INDEPENDENT_AMBULATORY_CARE_PROVIDER_SITE_OTHER): Payer: Medicare Other | Admitting: Family Medicine

## 2017-02-13 ENCOUNTER — Encounter: Payer: Self-pay | Admitting: Family Medicine

## 2017-02-13 VITALS — BP 118/76 | HR 89 | Resp 16 | Ht 66.0 in | Wt 169.2 lb

## 2017-02-13 DIAGNOSIS — I480 Paroxysmal atrial fibrillation: Secondary | ICD-10-CM

## 2017-02-13 DIAGNOSIS — R2681 Unsteadiness on feet: Secondary | ICD-10-CM | POA: Insufficient documentation

## 2017-02-13 DIAGNOSIS — E118 Type 2 diabetes mellitus with unspecified complications: Secondary | ICD-10-CM | POA: Diagnosis not present

## 2017-02-13 DIAGNOSIS — J302 Other seasonal allergic rhinitis: Secondary | ICD-10-CM

## 2017-02-13 DIAGNOSIS — I878 Other specified disorders of veins: Secondary | ICD-10-CM

## 2017-02-13 DIAGNOSIS — R32 Unspecified urinary incontinence: Secondary | ICD-10-CM | POA: Diagnosis not present

## 2017-02-13 DIAGNOSIS — G3184 Mild cognitive impairment, so stated: Secondary | ICD-10-CM | POA: Diagnosis not present

## 2017-02-13 DIAGNOSIS — L309 Dermatitis, unspecified: Secondary | ICD-10-CM | POA: Diagnosis not present

## 2017-02-13 DIAGNOSIS — F329 Major depressive disorder, single episode, unspecified: Secondary | ICD-10-CM | POA: Diagnosis not present

## 2017-02-13 DIAGNOSIS — Z23 Encounter for immunization: Secondary | ICD-10-CM

## 2017-02-13 DIAGNOSIS — F32A Depression, unspecified: Secondary | ICD-10-CM

## 2017-02-13 MED ORDER — ZOSTER VAC RECOMB ADJUVANTED 50 MCG/0.5ML IM SUSR: 0.5000 mL | Freq: Once | INTRAMUSCULAR | 1 refills | Status: AC

## 2017-02-13 NOTE — Patient Instructions (Signed)
Stop memantine, aspirin, tamsulosin, and metformin.

## 2017-02-14 DIAGNOSIS — L309 Dermatitis, unspecified: Secondary | ICD-10-CM | POA: Insufficient documentation

## 2017-02-14 DIAGNOSIS — I878 Other specified disorders of veins: Secondary | ICD-10-CM | POA: Insufficient documentation

## 2017-02-14 DIAGNOSIS — J309 Allergic rhinitis, unspecified: Secondary | ICD-10-CM | POA: Insufficient documentation

## 2017-02-14 DIAGNOSIS — R32 Unspecified urinary incontinence: Secondary | ICD-10-CM | POA: Insufficient documentation

## 2017-02-14 LAB — VITAMIN B12: VITAMIN B 12: 531 pg/mL (ref 232–1245)

## 2017-02-14 NOTE — Progress Notes (Signed)
Date:  02/13/2017   Name:  Tyler Kirk   DOB:  [birth year]   MRN:  [id number]  PCP:  Adline Potter, MD    Chief Complaint: Advice Only (BFP); Urinary Incontinence (Waking during sleep to go to bathroom. Bad odor having many leakage issues and may need hygeine counceling per letter from family. ); and Gait Problem   History of Present Illness:  This is a 83 y.o. male seen for initial consultation from Dr. [name] at [facility]. Hx unprovoked PE in 1990s, on warfarin since, told needs lifelong, also hx asymptomatic PAfib, On asa 3/wk as well since episode thought TIA initially but likely dehydration per daughter. Uses steroid cream prn for eczema, not bothering much now. More fatigued past 6 months, not walking or mowing grass as before, gait more unsteady, no recent falls. MCI on Namenda x 6 months, daughter feels no improvement. T2DM on low dose metformin, last a1c 7.0% in June. Urinary incontinence, some urgency, clothes usually urine stained, refuses to wear Depends. On Flomax x yrs, pt reports prostate removed due to cancer. Seasonal AR well controlled on Flonase prn. Depression on problem list but never treated. HLD in past, off statin x yrs. Sess optho yearly. DNR. Father died 87 unknown cause, mother diied 73 cancer, sister died 80 cancer. Tet and pneumo imms UTD, no known zoster imm.  CMP/CBC/TSH ok in June.  Review of Systems:  Review of Systems  Constitutional: Negative for chills and fever.  HENT: Negative for ear pain, sore throat and trouble swallowing.   Eyes: Negative for pain.  Respiratory: Negative for cough and shortness of breath.   Cardiovascular: Negative for chest pain and leg swelling.  Gastrointestinal: Negative for abdominal pain, constipation and diarrhea.  Endocrine: Negative for polydipsia and polyuria.  Genitourinary: Negative for dysuria.  Neurological: Negative for syncope and light-headedness.  Hematological: Negative for adenopathy.  Psychiatric/Behavioral:  Negative for agitation and hallucinations.    Patient Active Problem List   Diagnosis Date Noted  . Urinary incontinence 02/14/2017  . Gait instability 02/13/2017  . Microscopic hematuria 11/19/2016  . Absolute anemia 12/01/2014  . Arthritis 12/01/2014  . A-fib ([place]) 12/01/2014  . Benign fibroma of prostate 12/01/2014  . Cataract 12/01/2014  . Clinical depression 12/01/2014  . Esophagitis, reflux 12/01/2014  . Fatigue 12/01/2014  . Bilateral hearing loss 12/01/2014  . Hyperplasia, prostate 12/01/2014  . Mild cognitive impairment 12/01/2014  . Pulmonary embolism ([place]) 12/01/2014  . Basal cell papilloma 12/01/2014  . Dermatologic disease 10/17/2009  . Hypercholesterolemia without hypertriglyceridemia 05/11/2009  . Hernia, inguinal, unilateral 05/09/2006  . HLD (hyperlipidemia) 03/28/2006  . Diabetes mellitus, type 2 ([place]) 03/28/2006    Prior to Admission medications   Medication Sig Start Date End Date Taking? Authorizing Provider  fluticasone (FLONASE) 50 MCG/ACT nasal spray Place 2 sprays into the nose daily as needed. 06/07/14  Yes [provider]  glucose blood (ONE TOUCH ULTRA TEST) test strip 1 each by Other route every morning. Use as instructed Dx:E11.9 12/09/16  Yes Jerrol Banana., MD  mometasone (ELOCON) 0.1 % cream Apply 1 application topically daily. For 1 to 2 weeks only 12/26/16  Yes Jerrol Banana., MD  MULTIPLE VITAMIN PO Take by mouth. 08/01/10  Yes [provider]  warfarin (COUMADIN) 2 MG tablet Take 1 tablet (2 mg total) by mouth daily. 04/17/16  Yes Jerrol Banana., MD    No Known Allergies  Past Surgical History:  Procedure Laterality Date  . EYE  SURGERY     catarct  . PROSTATE SURGERY     prostate removed  . skin cancer: Removed  Left    Left arm near wrist area  . TOOTH EXTRACTION      Social History  Substance Use Topics  . Smoking status: Never Smoker  . Smokeless tobacco: Never Used  . Alcohol use No     Family History  Problem Relation Age of Onset  . Stroke Mother   . Hypertension Mother   . Stroke Father     Medication list has been reviewed and updated.  Physical Examination: BP 118/76   Pulse 89   Resp 16   Ht 5\' 6"  (1.676 m)   Wt 169 lb 3.2 oz (76.7 kg)   SpO2 99%   BMI 27.31 kg/m   Physical Exam  Constitutional: He is oriented to person, place, and time. He appears well-developed and well-nourished.  Smells of urine  HENT:  Head: Normocephalic and atraumatic.  Right Ear: External ear normal.  Left Ear: External ear normal.  Nose: Nose normal.  Mouth/Throat: Oropharynx is clear and moist.  TMs clear  Eyes: Pupils are equal, round, and reactive to light. Conjunctivae and EOM are normal. No scleral icterus.  Neck: Neck supple. No thyromegaly present.  Cardiovascular: Normal rate, regular rhythm and normal heart sounds.   Chronic venous stasis changes BLE  Pulmonary/Chest: Effort normal and breath sounds normal.  Abdominal: Soft. He exhibits no distension and no mass. There is no tenderness.  Musculoskeletal:  Trace BLE edema  Lymphadenopathy:    He has no cervical adenopathy.  Neurological: He is alert and oriented to person, place, and time. Coordination normal.  SLUMS 20/30 Romberg unsteady, gait wide-based with en bloc turning  Skin: Skin is warm and dry.  Scattered eczematous plaques  Psychiatric: His behavior is normal.  Affect slightly irritable GDS 7/15  Nursing note and vitals reviewed.   Assessment and Plan:  1. Paroxysmal atrial fibrillation (HCC) On long term warfarin since PE in 1990s,, monthly INR stable uncomfortable with stopping but willing to d/c asa as no clear indication for use  2. Type 2 diabetes mellitus with complication, without long-term current use of insulin (HCC) Well controlled on low dose metformin, recommend d/c, may restart if home BGs over 200 consistently., a1c goal < 8  3. Mild cognitive impairment Depression  likely contributing, Namenda not helping per daughter, recommend d/c  4. Depression, unspecified depression type Consider SSRI next visit  5. Urinary incontinence, unspecified type Hx prostate removal, unclear indication for Flomax and may contributing to incontinence, d/c, may restart for retention, Depends use discussed  6. Gait instability Worse past six months, not using cane/walker - Ambulatory referral to Physical Therapy - B12  7. Venous stasis of both lower extremities No current lesions noted, consider podiatry referral for nail care  8. Seasonal allergic rhinitis, unspecified trigger Cont Flonase prn  9. Eczema, unspecified type Cont Elocon prn  10. Need for zoster vaccination - Zoster Vac Recomb Adjuvanted [facility]) injection; Inject 0.5 mLs into the muscle once.  Dispense: 0.5 mL; Refill: 1  Return in about 4 weeks (around 03/13/2017).   60 mins spent with pt/family over half in counseling  Bryant Lipps [facility]  02/14/2017

## 2017-02-21 ENCOUNTER — Ambulatory Visit (INDEPENDENT_AMBULATORY_CARE_PROVIDER_SITE_OTHER): Payer: Medicare Other | Admitting: Emergency Medicine

## 2017-02-21 DIAGNOSIS — I482 Chronic atrial fibrillation, unspecified: Secondary | ICD-10-CM

## 2017-02-21 LAB — POCT INR
INR: 2.5
PT: 30.2

## 2017-02-21 NOTE — Patient Instructions (Signed)
Anticoagulation Warfarin Dose Instructions as of 02/21/2017      Tyler Kirk Tue Wed Thu Fri Sat   New Dose 2 mg 2 mg 2 mg 2 mg 2 mg 2 mg 2 mg    Description   Continue 2 mg daily F/U 4 weeks

## 2017-03-14 ENCOUNTER — Ambulatory Visit: Payer: Medicare Other | Admitting: Family Medicine

## 2017-03-19 ENCOUNTER — Ambulatory Visit (INDEPENDENT_AMBULATORY_CARE_PROVIDER_SITE_OTHER): Payer: Medicare Other | Admitting: Family Medicine

## 2017-03-19 ENCOUNTER — Encounter: Payer: Self-pay | Admitting: Family Medicine

## 2017-03-19 VITALS — BP 118/78 | HR 81 | Resp 16 | Ht 66.0 in | Wt 168.4 lb

## 2017-03-19 DIAGNOSIS — N400 Enlarged prostate without lower urinary tract symptoms: Secondary | ICD-10-CM | POA: Diagnosis not present

## 2017-03-19 DIAGNOSIS — E118 Type 2 diabetes mellitus with unspecified complications: Secondary | ICD-10-CM | POA: Diagnosis not present

## 2017-03-19 DIAGNOSIS — Z86711 Personal history of pulmonary embolism: Secondary | ICD-10-CM | POA: Diagnosis not present

## 2017-03-19 DIAGNOSIS — G3184 Mild cognitive impairment, so stated: Secondary | ICD-10-CM

## 2017-03-19 DIAGNOSIS — F32A Depression, unspecified: Secondary | ICD-10-CM

## 2017-03-19 DIAGNOSIS — F329 Major depressive disorder, single episode, unspecified: Secondary | ICD-10-CM

## 2017-03-19 DIAGNOSIS — R32 Unspecified urinary incontinence: Secondary | ICD-10-CM | POA: Diagnosis not present

## 2017-03-19 LAB — GLUCOSE, POCT (MANUAL RESULT ENTRY): POC GLUCOSE: 204 mg/dL — AB (ref 70–99)

## 2017-03-19 MED ORDER — CITALOPRAM HYDROBROMIDE 10 MG PO TABS
20.0000 mg | ORAL_TABLET | Freq: Every day | ORAL | 2 refills | Status: DC
Start: 2017-03-19 — End: 2017-04-16

## 2017-03-19 MED ORDER — TAMSULOSIN HCL 0.4 MG PO CAPS: 0.4000 mg | ORAL_CAPSULE | Freq: Every day | ORAL | 3 refills | Status: DC

## 2017-03-19 NOTE — Progress Notes (Signed)
Date:  03/19/2017   Name:  Tyler Kirk   DOB:  10-Apr-1920   MRN:  [id number]  PCP:  Adline Potter, MD    Chief Complaint: Urinary Incontinence (Daughter reports no products are helping and patient may not be using anything as he also may not be changing under garmets accept once a week. )   History of Present Illness:  This is a 83 y.o. male seen for one month f/u from initial consultation. Generally feels stronger, walking more. Home BGs 150 in AM before breakfast off metformin. Memory about the same off Namenda but urinary incontinence worse off Flomax, having urgency q1-2h and nocturia x 5. Resistant to using Depends despite obvious leakage and smell. Has not seen urology in many years and never for incontinence. GDS postive last visit, willing to try antidepressant. Daughter asks about stopping warfarin, on for hx of single PE in 1990s, has DVT filter, afib listed but no clear documentation and NSR on last EKG.  Review of Systems:  Review of Systems  Constitutional: Negative for chills and fever.  Respiratory: Negative for cough and shortness of breath.   Cardiovascular: Negative for chest pain, palpitations and leg swelling.  Genitourinary: Negative for dysuria.  Neurological: Negative for syncope and light-headedness.    Patient Active Problem List   Diagnosis Date Noted  . Urinary incontinence 02/14/2017  . Venous stasis of both lower extremities 02/14/2017  . Allergic rhinitis 02/14/2017  . Eczema 02/14/2017  . Gait instability 02/13/2017  . Microscopic hematuria 11/19/2016  . Arthritis 12/01/2014  . A-fib ([place]) 12/01/2014  . Benign fibroma of prostate 12/01/2014  . Cataract 12/01/2014  . Clinical depression 12/01/2014  . Esophagitis, reflux 12/01/2014  . Fatigue 12/01/2014  . Bilateral hearing loss 12/01/2014  . Hyperplasia, prostate 12/01/2014  . Mild cognitive impairment 12/01/2014  . Hx pulmonary embolism 12/01/2014  . Basal cell papilloma 12/01/2014  .  Hernia, inguinal, unilateral 05/09/2006  . HLD (hyperlipidemia) 03/28/2006  . Diabetes mellitus, type 2 ([place]) 03/28/2006    Prior to Admission medications   Medication Sig Start Date End Date Taking? Authorizing Provider  fluticasone (FLONASE) 50 MCG/ACT nasal spray Place 2 sprays into the nose daily as needed. 06/07/14  Yes [provider]  glucose blood (ONE TOUCH ULTRA TEST) test strip 1 each by Other route every morning. Use as instructed Dx:E11.9 12/09/16  Yes Jerrol Banana., MD  mometasone (ELOCON) 0.1 % cream Apply 1 application topically daily. For 1 to 2 weeks only 12/26/16  Yes Jerrol Banana., MD  MULTIPLE VITAMIN PO Take by mouth. 08/01/10  Yes [provider]  warfarin (COUMADIN) 2 MG tablet Take 1 tablet (2 mg total) by mouth daily. 04/17/16  Yes Jerrol Banana., MD  citalopram (CELEXA) 10 MG tablet Take 2 tablets (20 mg total) by mouth daily. 03/19/17   Laddie Math, Gwyndolyn Saxon, MD  tamsulosin (FLOMAX) 0.4 MG CAPS capsule Take 1 capsule (0.4 mg total) by mouth daily. 03/19/17   Adline Potter, MD    No Known Allergies  Past Surgical History:  Procedure Laterality Date  . EYE SURGERY     catarct  . PROSTATE SURGERY     prostate removed  . skin cancer: Removed  Left    Left arm near wrist area  . TOOTH EXTRACTION      Social History  Substance Use Topics  . Smoking status: Never Smoker  . Smokeless tobacco: Never Used  . Alcohol use No    Family  History  Problem Relation Age of Onset  . Stroke Mother   . Hypertension Mother   . Stroke Father     Medication list has been reviewed and updated.  Physical Examination: BP 118/78   Pulse 81   Resp 16   Ht 5\' 6"  (1.676 m)   Wt 168 lb 6.4 oz (76.4 kg)   SpO2 97%   BMI 27.18 kg/m   Physical Exam  Constitutional: He appears well-developed and well-nourished.  Cardiovascular: Normal rate, regular rhythm and normal heart sounds.   Pulmonary/Chest: Effort normal and breath sounds  normal.  Musculoskeletal: He exhibits no edema.  Neurological: He is alert.  Skin: Skin is warm and dry.  Psychiatric: His behavior is normal.  Flat affect  Nursing note and vitals reviewed.   Assessment and Plan:  1. Type 2 diabetes mellitus with complication, without long-term current use of insulin (HCC) FSBG 204 after lunch today, adequate control, feels better off metformin - POCT Glucose (CBG)  2. Urinary incontinence, unspecified type Likely mixed urge and overflow, will ask urology for input before starting anticholinergic which may increase confusion and fall risk, encouraged Depends use - Ambulatory referral to Urology  3. Hyperplasia, prostate Urinary symptoms worse off Flomax, restart at previous dose  4. Depression, unspecified depression type GDS positive last visit, trial Celexa 10 mg daily x 1 month  5. Mild cognitive impairment Stable off Namenda  6. Hx pulmonary embolism Advised discuss possible d/c warfarin with Dr. [name]  Return in about 4 weeks (around 04/16/2017).  [name]. [name]  03/19/2017

## 2017-03-21 ENCOUNTER — Ambulatory Visit (INDEPENDENT_AMBULATORY_CARE_PROVIDER_SITE_OTHER): Payer: Medicare Other

## 2017-03-21 DIAGNOSIS — I482 Chronic atrial fibrillation, unspecified: Secondary | ICD-10-CM

## 2017-03-21 LAB — POCT INR
INR: 2.9
PT: 35

## 2017-03-21 NOTE — Patient Instructions (Signed)
Anticoagulation Warfarin Dose Instructions as of 03/21/2017      Tyler Kirk Tue Wed Thu Fri Sat   New Dose 2 mg 2 mg 2 mg 2 mg 2 mg 2 mg 2 mg    Description   Continue 2 mg daily F/U 4 weeks

## 2017-03-24 ENCOUNTER — Telehealth: Payer: Self-pay

## 2017-03-24 NOTE — Telephone Encounter (Signed)
Had severe diarrhea after just 1 dose of Celexa. D/C after that and not sure if need to start another medication or just stay off these all together?

## 2017-03-25 NOTE — Telephone Encounter (Signed)
Advised 

## 2017-03-25 NOTE — Telephone Encounter (Signed)
Stay off Celexa will reassess next visit.

## 2017-03-31 NOTE — Progress Notes (Signed)
04/01/2017 3:58 PM   Tyler Kirk Sep 08, 1919 [id number]  Referring provider: Adline Potter, MD [address] [place], [place] [address]  Chief Complaint  Patient presents with  . New Patient (Initial Visit)    Urinary incontinence referred by Dr. Vicente Masson    HPI: Patient is a 83 year old Caucasian male with urinary incontinence referred by his PCP, Dr. Vicente Masson with his daughter, Tyler Kirk.    BPH WITH LUTS  (prostate and/or bladder) His IPSS score today is 20, which is severe lower urinary tract symptomatology.  He is unhappy with his quality life due to his urinary symptoms. His PVR is 238 mL.  His major complaints today are frequency, urgency, nocturia and incontinence.  He has had these symptoms for 2 to 3 years.  He denies any dysuria, hematuria or suprapubic pain.  He currently taking tamsulosin 0.4 mg daily.  His has had ? RRP several years ago for prostate cancer.   He also denies any recent fevers, chills, nausea or vomiting.     IPSS    Row Name 04/01/17 1400         International Prostate Symptom Score   How often have you had the sensation of not emptying your bladder? Almost always     How often have you had to urinate less than every two hours? Almost always     How often have you found you stopped and started again several times when you urinated? Less than half the time     How often have you found it difficult to postpone urination? Not at All     How often have you had a weak urinary stream? Almost always     How often have you had to strain to start urination? Not at All     How many times did you typically get up at night to urinate? 3 Times     Total IPSS Score 20       Quality of Life due to urinary symptoms   If you were to spend the rest of your life with your urinary condition just the way it is now how would you feel about that? Unhappy        Score:  1-7 Mild 8-19 Moderate 20-35 Severe    PMH: Past Medical History:  Diagnosis  Date  . Afib ([place])   . Arthritis   . BPH (benign prostatic hypertrophy)   . Cataracts, bilateral   . Depression   . Diabetes mellitus without complication ([place])   . GERD (gastroesophageal reflux disease)   . Hernia, inguinal    Chronic Large Inguinal Hernia  . Hyperlipidemia   . Hypertension   . Prostate cancer ([place])   . Pulmonary embolism ([place])   . UTI (urinary tract infection)     Surgical History: Past Surgical History:  Procedure Laterality Date  . bladder diverticulum removed  1960  . EYE SURGERY     catarct  . PROSTATE SURGERY     prostate removed  . skin cancer: Removed  Left    Left arm near wrist area  . TOOTH EXTRACTION      Home Medications:  Allergies as of 04/01/2017      Reactions   Celexa [citalopram Hydrobromide]    diarrhea      Medication List       Accurate as of 04/01/17  3:58 PM. Always use your most recent med list.  citalopram 10 MG tablet Commonly known as:  CELEXA Take 2 tablets (20 mg total) by mouth daily.   docusate sodium 250 MG capsule Commonly known as:  COLACE Take 250 mg by mouth daily.   fluticasone 50 MCG/ACT nasal spray Commonly known as:  FLONASE Place 2 sprays into the nose daily as needed.   glucose blood test strip Commonly known as:  ONE TOUCH ULTRA TEST 1 each by Other route every morning. Use as instructed Dx:E11.9   mometasone 0.1 % cream Commonly known as:  ELOCON Apply 1 application topically daily. For 1 to 2 weeks only   MULTIPLE VITAMIN PO Take by mouth.   tamsulosin 0.4 MG Caps capsule Commonly known as:  FLOMAX Take 1 capsule (0.4 mg total) by mouth daily.   warfarin 2 MG tablet Commonly known as:  COUMADIN Take 1 tablet (2 mg total) by mouth daily.       Allergies:  Allergies  Allergen Reactions  . Celexa [Citalopram Hydrobromide]     diarrhea    Family History: Family History  Problem Relation Age of Onset  . Stroke Mother   . Hypertension Mother   . Stroke Father   .  Prostate cancer Neg Hx   . Kidney cancer Neg Hx   . Bladder Cancer Neg Hx     Social History:  reports that he has never smoked. He has never used smokeless tobacco. He reports that he does not drink alcohol or use drugs.  ROS: UROLOGY Frequent Urination?: Yes Hard to postpone urination?: Yes Burning/pain with urination?: No Get up at night to urinate?: Yes Leakage of urine?: Yes Urine stream starts and stops?: No Trouble starting stream?: No Do you have to strain to urinate?: No Blood in urine?: No Urinary tract infection?: No Sexually transmitted disease?: No Injury to kidneys or bladder?: No Painful intercourse?: No Weak stream?: No Erection problems?: No Penile pain?: No  Gastrointestinal Nausea?: No Vomiting?: No Indigestion/heartburn?: No Diarrhea?: No Constipation?: No  Constitutional Fever: No Night sweats?: No Weight loss?: No Fatigue?: Yes  Skin Skin rash/lesions?: Yes Itching?: Yes  Eyes Blurred vision?: No Double vision?: No  Ears/Nose/Throat Sore throat?: No Sinus problems?: Yes  Hematologic/Lymphatic Swollen glands?: No Easy bruising?: Yes  Cardiovascular Leg swelling?: No Chest pain?: No  Respiratory Cough?: No Shortness of breath?: Yes  Endocrine Excessive thirst?: No  Musculoskeletal Back pain?: No Joint pain?: No  Neurological Headaches?: No Dizziness?: No  Psychologic Depression?: Yes Anxiety?: No  Physical Exam: BP (!) 151/81   Pulse 73   Ht 5\' 8"  (1.727 m)   Wt 168 lb 8 oz (76.4 kg)   BMI 25.62 kg/m   Constitutional: Well nourished. Alert and oriented, No acute distress. HEENT: [place] AT, moist mucus membranes. Trachea midline, no masses. Cardiovascular: No clubbing, cyanosis, or edema. Respiratory: Normal respiratory effort, no increased work of breathing. GI: Abdomen is soft, non tender, non distended, no abdominal masses. Liver and spleen not palpable.  No hernias appreciated.  Stool sample for occult  testing is not indicated.   GU: No CVA tenderness.  No bladder fullness or masses.  Patient with uncircumcised phallus.    Foreskin easily retracted but slightly edematous.  Urethral meatus is patent.  No penile discharge. No penile lesions or rashes. Scrotum without lesions, cysts, rashes and/or edema.  Left testicle could not be palpated due to a large firm mass that extends from the scrotum up into the left inguinal area ? Hernia ? Malignancy.   Right testicles  is located scrotally.    Rectal: Patient with  normal sphincter tone. Anus and perineum without scarring or rashes.  Skin breakdown is seen in the gluteal folds. No rectal masses are appreciated. Prostate is absent, two nodules are appreciated. Seminal vesicles are not palpated.   Skin: With rashes, scattered over legs and arms, no bruises or suspicious lesions. Lymph: No cervical or inguinal adenopathy. Neurologic: Grossly intact, no focal deficits, moving all 4 extremities. Psychiatric: Normal mood and affect.  Laboratory Data: Lab Results  Component Value Date   WBC 6.6 12/26/2016   HGB 15.3 12/26/2016   HCT 46.5 12/26/2016   MCV 89 12/26/2016   PLT 235 12/26/2016    Lab Results  Component Value Date   CREATININE 0.96 12/26/2016     Lab Results  Component Value Date   HGBA1C 7.0 12/26/2016    Lab Results  Component Value Date   TSH 3.550 12/26/2016    Lab Results  Component Value Date   AST 19 12/26/2016   Lab Results  Component Value Date   ALT 20 12/26/2016    I have reviewed the labs.   Pertinent Imaging: Results for ENOC, GETTER (MRN [id number]) as of 04/01/2017 15:31  Ref. Range 04/01/2017 15:12  Scan Result Unknown 238   I have independently reviewed the films.    Assessment & Plan:    1. History of prostate cancer  - nodules palpated in the rectum  - will check PSA today   2. Left inguinal mass  - ? Hernia - it is listed in PMHx  - if PSA returns elevated - will obtain a contrast CT  - if PSA is undetectable - will obtain a scrotal ultrasound  3. BPH with LUTS  - IPSS score is 20/5  - Continue conservative management, avoiding bladder irritants and timed voiding's  - most bothersome symptoms is/are frequency, urgency, nocturia and incontinence  - Continue tamsulosin 0.4 mg daily  - Reassess once PSA results and imaging results are available   4. Incontinence  - see above  Return for pending PSA results.  These notes generated with voice recognition software. I apologize for typographical errors.  Zara Council, [facility] [address], [place], [place] [address] [phone]

## 2017-04-01 ENCOUNTER — Ambulatory Visit (INDEPENDENT_AMBULATORY_CARE_PROVIDER_SITE_OTHER): Payer: Medicare Other | Admitting: Urology

## 2017-04-01 ENCOUNTER — Encounter: Payer: Self-pay | Admitting: Urology

## 2017-04-01 VITALS — BP 151/81 | HR 73 | Ht 68.0 in | Wt 168.5 lb

## 2017-04-01 DIAGNOSIS — Z8546 Personal history of malignant neoplasm of prostate: Secondary | ICD-10-CM

## 2017-04-01 DIAGNOSIS — N401 Enlarged prostate with lower urinary tract symptoms: Secondary | ICD-10-CM | POA: Diagnosis not present

## 2017-04-01 DIAGNOSIS — N138 Other obstructive and reflux uropathy: Secondary | ICD-10-CM | POA: Diagnosis not present

## 2017-04-01 DIAGNOSIS — N39498 Other specified urinary incontinence: Secondary | ICD-10-CM

## 2017-04-01 DIAGNOSIS — R1909 Other intra-abdominal and pelvic swelling, mass and lump: Secondary | ICD-10-CM | POA: Diagnosis not present

## 2017-04-01 LAB — BLADDER SCAN AMB NON-IMAGING: Scan Result: 238

## 2017-04-02 LAB — PSA: PROSTATE SPECIFIC AG, SERUM: 32.5 ng/mL — AB (ref 0.0–4.0)

## 2017-04-03 ENCOUNTER — Telehealth: Payer: Self-pay

## 2017-04-03 ENCOUNTER — Other Ambulatory Visit: Payer: Self-pay

## 2017-04-03 DIAGNOSIS — Z8546 Personal history of malignant neoplasm of prostate: Secondary | ICD-10-CM

## 2017-04-03 NOTE — Telephone Encounter (Signed)
Spoke with pt daughter, Wells Guiles, in reference to PSA results and needing a CT and bone scan. Wells Guiles voiced understanding. Orders were placed.

## 2017-04-03 NOTE — Telephone Encounter (Signed)
-----   Message from Nori Riis, PA-C sent at 04/02/2017  1:51 PM EDT ----- Please let Mr. Weatherall know that his PSA is elevated.  We need to get the CT scan and bone scan ordered.  I left a message for Mr. Vondrak to call me back.

## 2017-04-03 NOTE — Telephone Encounter (Signed)
LMOM

## 2017-04-08 ENCOUNTER — Ambulatory Visit: Payer: Medicare Other | Admitting: Family Medicine

## 2017-04-10 ENCOUNTER — Telehealth: Payer: Self-pay | Admitting: Urology

## 2017-04-10 NOTE — Telephone Encounter (Signed)
Would you check on the status of Tyler Kirk bone scan and CT scan?

## 2017-04-10 NOTE — Telephone Encounter (Signed)
I told scheduling on Tuesday that he needed to be scheduled ASAP I sent another message to them asking them to call him. I will let you know when this has been done.  Sharyn Lull

## 2017-04-16 ENCOUNTER — Encounter: Payer: Self-pay | Admitting: Family Medicine

## 2017-04-16 ENCOUNTER — Ambulatory Visit (INDEPENDENT_AMBULATORY_CARE_PROVIDER_SITE_OTHER): Payer: Medicare Other | Admitting: Family Medicine

## 2017-04-16 VITALS — BP 136/80 | HR 82 | Resp 16 | Ht 68.0 in | Wt 169.0 lb

## 2017-04-16 DIAGNOSIS — F329 Major depressive disorder, single episode, unspecified: Secondary | ICD-10-CM

## 2017-04-16 DIAGNOSIS — G3184 Mild cognitive impairment, so stated: Secondary | ICD-10-CM | POA: Diagnosis not present

## 2017-04-16 DIAGNOSIS — F32A Depression, unspecified: Secondary | ICD-10-CM

## 2017-04-16 DIAGNOSIS — R972 Elevated prostate specific antigen [PSA]: Secondary | ICD-10-CM | POA: Diagnosis not present

## 2017-04-16 DIAGNOSIS — L308 Other specified dermatitis: Secondary | ICD-10-CM

## 2017-04-16 DIAGNOSIS — R32 Unspecified urinary incontinence: Secondary | ICD-10-CM

## 2017-04-16 DIAGNOSIS — Z86711 Personal history of pulmonary embolism: Secondary | ICD-10-CM | POA: Diagnosis not present

## 2017-04-16 DIAGNOSIS — E118 Type 2 diabetes mellitus with unspecified complications: Secondary | ICD-10-CM

## 2017-04-16 MED ORDER — MIRABEGRON ER 25 MG PO TB24: 25.0000 mg | ORAL_TABLET | Freq: Every day | ORAL | 2 refills | Status: DC

## 2017-04-16 MED ORDER — MOMETASONE FUROATE 0.1 % EX CREA: 1.0000 "application " | TOPICAL_CREAM | Freq: Two times a day (BID) | CUTANEOUS | 2 refills | Status: AC | PRN

## 2017-04-16 MED ORDER — ASPIRIN 81 MG PO TABS: 81.0000 mg | ORAL_TABLET | Freq: Every day | ORAL | Status: AC

## 2017-04-16 NOTE — Progress Notes (Signed)
Date:  04/16/2017   Name:  Tyler Kirk   DOB:  January 16, 1920   MRN:  [id number]  PCP:  Tyler Potter, MD    Chief Complaint: Depression (Took Celexa for one month) and Coagulation Disorder (Needs refill Warfarin following Tyler Kirk now but wondered if you can refill this while here. )   History of Present Illness:  This is a 83 y.o. male seen for one month f/u. Saw urology, PSA elevated, scheduled for CT/bone scan next week, told to stop Flomax as no evidence retention. Home BGs continue to stay under 200 off metformin. Intolerant Celexa, caused diarrhea. Still c/o fatigue but able to walk half mile in mornings and mow grass. Rash on arms flaring again, responsive to Elocon in past. Family ok with stopping warfarin.   Review of Systems:  Review of Systems  Constitutional: Negative for chills and fever.  Respiratory: Negative for cough and shortness of breath.   Cardiovascular: Negative for chest pain and leg swelling.  Endocrine: Negative for polydipsia and polyuria.  Genitourinary: Negative for difficulty urinating.  Neurological: Negative for syncope and light-headedness.    Patient Active Problem List   Diagnosis Date Noted  . Urinary incontinence 02/14/2017  . Venous stasis of both lower extremities 02/14/2017  . Allergic rhinitis 02/14/2017  . Eczema 02/14/2017  . Gait instability 02/13/2017  . Microscopic hematuria 11/19/2016  . Arthritis 12/01/2014  . A-fib ([place]) 12/01/2014  . Benign fibroma of prostate 12/01/2014  . Cataract 12/01/2014  . Clinical depression 12/01/2014  . Esophagitis, reflux 12/01/2014  . Fatigue 12/01/2014  . Bilateral hearing loss 12/01/2014  . Hyperplasia, prostate 12/01/2014  . Mild cognitive impairment 12/01/2014  . Hx pulmonary embolism 12/01/2014  . Basal cell papilloma 12/01/2014  . Hernia, inguinal, unilateral 05/09/2006  . HLD (hyperlipidemia) 03/28/2006  . Diabetes mellitus, type 2 ([place]) 03/28/2006    Prior to Admission  medications   Medication Sig Start Date End Date Taking? Authorizing Provider  docusate sodium (COLACE) 250 MG capsule Take 250 mg by mouth daily as needed.   Yes [provider]  fluticasone (FLONASE) 50 MCG/ACT nasal spray Place 2 sprays into the nose daily as needed. 06/07/14  Yes [provider]  glucose blood (ONE TOUCH ULTRA TEST) test strip 1 each by Other route every morning. Use as instructed Dx:E11.9 12/09/16  Yes Tyler Kirk., MD  mometasone (ELOCON) 0.1 % cream Apply 1 application topically 2 (two) times daily as needed. 04/16/17  Yes Tyler Garramone, MD  MULTIPLE VITAMIN PO Take 1 tablet by mouth daily. 08/01/10  Yes [provider]  aspirin 81 MG tablet Take 1 tablet (81 mg total) by mouth daily. 04/16/17   Tyler Kirk, Gwyndolyn Saxon, MD  mirabegron ER (MYRBETRIQ) 25 MG TB24 tablet Take 1 tablet (25 mg total) by mouth daily. 04/16/17   Tyler Potter, MD    Allergies  Allergen Reactions  . Celexa [Citalopram Hydrobromide]     diarrhea    Past Surgical History:  Procedure Laterality Date  . bladder diverticulum removed  1960  . EYE SURGERY     catarct  . PROSTATE SURGERY     prostate removed  . skin cancer: Removed  Left    Left arm near wrist area  . TOOTH EXTRACTION      Social History  Substance Use Topics  . Smoking status: Never Smoker  . Smokeless tobacco: Never Used  . Alcohol use No    Family History  Problem Relation Age of Onset  .  Stroke Mother   . Hypertension Mother   . Stroke Father   . Prostate cancer Neg Hx   . Kidney cancer Neg Hx   . Bladder Cancer Neg Hx     Medication list has been reviewed and updated.  Physical Examination: BP 136/80   Pulse 82   Resp 16   Ht 5\' 8"  (1.727 m)   Wt 169 lb (76.7 kg)   SpO2 99%   BMI 25.70 kg/m   Physical Exam  Constitutional: He appears well-developed and well-nourished.  Cardiovascular: Normal rate, regular rhythm and normal heart sounds.   Pulmonary/Chest: Effort normal  and breath sounds normal.  Musculoskeletal: He exhibits no edema.  Neurological: He is alert.  Skin: Skin is warm and dry.  Diffuse eczematous rash over arms  Psychiatric: His behavior is normal.  Flat affect  Nursing note and vitals reviewed.   Assessment and Plan:  1. Type 2 diabetes mellitus with complication, without long-term current use of insulin (HCC) Stable off metformin, may decrease FSBG to once weekly  2. Urinary incontinence, unspecified type Still a major hygiene issue, trial Myrbetriq, stay off Flomax, urology following  3. Elevated PSA For CT/bone scan next week per urology  4. Mild cognitive impairment Stable off Namenda  5. Other eczema Refill Elocon  6. Depression, unspecified depression type Intolerant Celexa (took only one dose), depression screen positive, consider alternate antidepressant if fatigue persists  7. Hx pulmonary embolism Discussed with pt/daughter, will stop warfarin and begin asa daily  Return in about 4 weeks (around 05/14/2017) for with Dr. Rosanna Kirk.   I am available for further consultation as needed.  Tyler Kirk. [facility]  04/16/2017

## 2017-04-18 ENCOUNTER — Ambulatory Visit: Payer: Self-pay

## 2017-04-21 NOTE — Telephone Encounter (Signed)
He is scheduled for his Bone scan for 04-23-17   [facility]

## 2017-04-23 ENCOUNTER — Encounter
Admission: RE | Admit: 2017-04-23 | Discharge: 2017-04-23 | Disposition: A | Discharge status: Home / Self Care | Payer: Medicare Other | Source: Ambulatory Visit | Attending: Urology | Admitting: Urology

## 2017-04-23 ENCOUNTER — Ambulatory Visit
Admission: RE | Admit: 2017-04-23 | Discharge: 2017-04-23 | Disposition: A | Discharge status: Home / Self Care | Payer: Medicare Other | Source: Ambulatory Visit | Attending: Urology | Admitting: Urology

## 2017-04-23 DIAGNOSIS — I700 Atherosclerosis of aorta: Secondary | ICD-10-CM | POA: Insufficient documentation

## 2017-04-23 DIAGNOSIS — N323 Diverticulum of bladder: Secondary | ICD-10-CM | POA: Diagnosis not present

## 2017-04-23 DIAGNOSIS — Z8546 Personal history of malignant neoplasm of prostate: Secondary | ICD-10-CM | POA: Diagnosis not present

## 2017-04-23 DIAGNOSIS — K4090 Unilateral inguinal hernia, without obstruction or gangrene, not specified as recurrent: Secondary | ICD-10-CM | POA: Diagnosis not present

## 2017-04-23 DIAGNOSIS — I517 Cardiomegaly: Secondary | ICD-10-CM | POA: Insufficient documentation

## 2017-04-23 DIAGNOSIS — I2510 Atherosclerotic heart disease of native coronary artery without angina pectoris: Secondary | ICD-10-CM | POA: Diagnosis not present

## 2017-04-23 LAB — POCT I-STAT CREATININE: Creatinine, Ser: 0.9 mg/dL (ref 0.61–1.24)

## 2017-04-23 MED ORDER — TECHNETIUM TC 99M MEDRONATE IV KIT
25.0000 | PACK | Freq: Once | INTRAVENOUS | Status: AC | PRN
  Administered 2017-04-23: 25.26 via INTRAVENOUS

## 2017-04-23 MED ORDER — IOPAMIDOL (ISOVUE-300) INJECTION 61%
100.0000 mL | Freq: Once | INTRAVENOUS | Status: AC | PRN
  Administered 2017-04-23: 100 mL via INTRAVENOUS

## 2017-04-24 NOTE — Telephone Encounter (Signed)
LMOM for patient daughter to call office back about results.

## 2017-04-24 NOTE — Telephone Encounter (Signed)
-----   Message from Nori Riis, PA-C sent at 04/23/2017  4:12 PM EDT ----- Please have Tyler Kirk and his daughter know the imaging results are in.  At first glance, it looks like there is no spread of the prostate cancer.  We need to discuss the next steps.

## 2017-04-25 NOTE — Telephone Encounter (Signed)
Spoke with [name] patient's daughter and gave results. Patient transferred to Leodis Sias to set up appointment to talk about next steps.

## 2017-04-28 NOTE — Progress Notes (Signed)
04/29/2017 8:30 PM   GREG CRATTY [birth year] [id number]  Referring provider: Jerrol Banana., MD [address] [place], [place] [address]  Chief Complaint  Patient presents with  . Results    HPI: Patient is a 83 year old Caucasian male with urinary incontinence and a history of prostate cancer s/p RRP with a rising PSA who presents today to discuss his bone scan and CT scan results with his daughter, Rodena Piety.    History of prostate cancer Patient and family members are unsure if patient underwent RRP or cryotherapy for prostate cancer.  Pathology is unknown.  Recent PSA was found to be 32.5 ng/mL on 04/01/2017.  Contrast CT noted small somewhat dense appearing prostate gland with internal calcifications. No regional adenopathy or evidence of osseous metastatic disease.  Suspected fibrosis in the lung bases with some scattered mild scarring.  Aortic Atherosclerosis.  Coronary atherosclerosis with mild cardiomegaly.  2 mm calculus in the left eccentric bladder diverticulum.  Multiple bladder cellules.  Stable appearance of a large loop of sigmoid colon extending in a left-sided indirect inguinal hernia. Right indirect inguinal hernia contains adipose tissue.  Prominent stool throughout the colon favors constipation.  Suspected fecal incontinence.  Bone scan noted no evidence of metastatic disease.  Today, he still complains of frequency, urgency, nocturia and incontinence.  Patient did not take the tamsulosin that was prescribed to him.  He is currently taking Myrbetriq as prescribed by his primary care physician, [place].  He has not had gross hematuria, suprapubic pain or dysuria.  He has not had any recent fevers, chills, nausea or vomiting.  His PVR is 240 mL.     PMH: Past Medical History:  Diagnosis Date  . Afib ([place])   . Arthritis   . BPH (benign prostatic hypertrophy)   . Cataracts, bilateral   . Depression   . Diabetes mellitus without complication  ([place])   . GERD (gastroesophageal reflux disease)   . Hernia, inguinal    Chronic Large Inguinal Hernia  . Hyperlipidemia   . Hypertension   . Prostate cancer ([place])   . Pulmonary embolism ([place])   . UTI (urinary tract infection)     Surgical History: Past Surgical History:  Procedure Laterality Date  . bladder diverticulum removed  1960  . EYE SURGERY     catarct  . PROSTATE SURGERY     prostate removed  . skin cancer: Removed  Left    Left arm near wrist area  . TOOTH EXTRACTION      Home Medications:  Allergies as of 04/29/2017      Reactions   Celexa [citalopram Hydrobromide]    diarrhea      Medication List        Accurate as of 04/29/17 11:59 PM. Always use your most recent med list.          aspirin 81 MG tablet Take 1 tablet (81 mg total) by mouth daily.   docusate sodium 250 MG capsule Commonly known as:  COLACE Take 250 mg by mouth daily as needed.   fluticasone 50 MCG/ACT nasal spray Commonly known as:  FLONASE Place 2 sprays into the nose daily as needed.   glucose blood test strip Commonly known as:  ONE TOUCH ULTRA TEST 1 each by Other route every morning. Use as instructed Dx:E11.9   mirabegron ER 25 MG Tb24 tablet Commonly known as:  MYRBETRIQ Take 1 tablet (25 mg total) by mouth daily.   mometasone  0.1 % cream Commonly known as:  ELOCON Apply 1 application topically 2 (two) times daily as needed.   MULTIPLE VITAMIN PO Take 1 tablet by mouth daily.       Allergies:  Allergies  Allergen Reactions  . Celexa [Citalopram Hydrobromide]     diarrhea    Family History: Family History  Problem Relation Age of Onset  . Stroke Mother   . Hypertension Mother   . Stroke Father   . Prostate cancer Neg Hx   . Kidney cancer Neg Hx   . Bladder Cancer Neg Hx     Social History:  reports that  has never smoked. he has never used smokeless tobacco. He reports that he does not drink alcohol or use drugs.  ROS: UROLOGY Frequent  Urination?: Yes Hard to postpone urination?: Yes Burning/pain with urination?: No Get up at night to urinate?: Yes Leakage of urine?: Yes Urine stream starts and stops?: No Trouble starting stream?: No Do you have to strain to urinate?: No Blood in urine?: No Urinary tract infection?: No Sexually transmitted disease?: No Injury to kidneys or bladder?: No Painful intercourse?: No Weak stream?: No Erection problems?: No Penile pain?: No  Gastrointestinal Nausea?: No Vomiting?: No Indigestion/heartburn?: No Diarrhea?: No Constipation?: No  Constitutional Fever: No Night sweats?: No Weight loss?: No Fatigue?: No  Skin Skin rash/lesions?: No Itching?: No  Eyes Blurred vision?: No Double vision?: No  Ears/Nose/Throat Sore throat?: No Sinus problems?: No  Hematologic/Lymphatic Swollen glands?: No Easy bruising?: No  Cardiovascular Leg swelling?: No Chest pain?: No  Respiratory Cough?: No Shortness of breath?: No  Endocrine Excessive thirst?: No  Musculoskeletal Back pain?: No Joint pain?: No  Neurological Headaches?: No Dizziness?: No  Psychologic Depression?: No Anxiety?: No  Physical Exam: BP (!) 151/80   Pulse 73   Ht 5\' 8"  (1.727 m)   Wt 170 lb (77.1 kg)   BMI 25.85 kg/m   Constitutional: Well nourished. Alert and oriented, No acute distress. HEENT: [place] AT, moist mucus membranes. Trachea midline, no masses. Cardiovascular: No clubbing, cyanosis, or edema. Respiratory: Normal respiratory effort, no increased work of breathing. GI: Abdomen is soft, non tender, non distended, no abdominal masses. Liver and spleen not palpable.  No hernias appreciated.  Stool sample for occult testing is not indicated.   GU: No CVA tenderness.  No bladder fullness or masses.  Patient with uncircumcised phallus.    Foreskin easily retracted but slightly edematous.  Urethral meatus is patent.  No penile discharge. No penile lesions or rashes. Scrotum without  lesions, cysts, rashes and/or edema.  Left testicle could not be palpated due to a large firm mass that extends from the scrotum up into the left inguinal area ? Hernia ? Malignancy.   Right testicles is located scrotally.    Rectal: Patient with  normal sphincter tone. Anus and perineum without scarring or rashes.  Skin breakdown is seen in the gluteal folds. No rectal masses are appreciated. Prostate is absent, two nodules are appreciated. Seminal vesicles are not palpated.   Skin: With rashes, scattered over legs and arms, no bruises or suspicious lesions. Lymph: No cervical or inguinal adenopathy. Neurologic: Grossly intact, no focal deficits, moving all 4 extremities. Psychiatric: Normal mood and affect.  Laboratory Data: Lab Results  Component Value Date   WBC 6.6 12/26/2016   HGB 15.3 12/26/2016   HCT 46.5 12/26/2016   MCV 89 12/26/2016   PLT 235 12/26/2016    Lab Results  Component Value Date  CREATININE 0.90 04/23/2017     Lab Results  Component Value Date   HGBA1C 7.0 12/26/2016    Lab Results  Component Value Date   TSH 3.550 12/26/2016    Lab Results  Component Value Date   AST 19 12/26/2016   Lab Results  Component Value Date   ALT 20 12/26/2016    I have reviewed the labs.   Pertinent Imaging: CLINICAL DATA:  Restaging prostate cancer.  EXAM: CT ABDOMEN AND PELVIS WITHOUT AND WITH CONTRAST  TECHNIQUE: Multidetector CT imaging of the abdomen and pelvis was performed following the standard protocol before and following the bolus administration of intravenous contrast.  CONTRAST:  131mL ISOVUE-300 IOPAMIDOL (ISOVUE-300) INJECTION 61%  COMPARISON:  05/28/2006  FINDINGS: Lower chest: Progressive peripheral interstitial accentuation in the lung bases, left greater than right, suggesting fibrosis. Scattered scarring in the lung bases. Coronary atherosclerosis. Mild cardiomegaly.  Hepatobiliary: Borderline but chronic intrahepatic  biliary dilatation. The common bile duct does not appear significantly dilated. No focal liver lesion identified.  Pancreas: Punctate calcifications throughout the pancreatic parenchyma compatible with chronic calcific pancreatitis.  Spleen: Unremarkable  Adrenals/Urinary Tract: Mild scarring in the left mid kidney and left kidney lower pole. 2 mm bladder calculus in a bladder diverticulum eccentric to the left on image 81/10. Multiple cellules along the posterior urinary bladder.  Stomach/Bowel: Notably large loop of sigmoid colon in a indirect left inguinal hernia. Indirect right inguinal hernia contains adipose tissue. Prominent stool throughout the colon favors constipation. Suspected fecal incontinence.  Vascular/Lymphatic: Aortoiliac atherosclerotic vascular disease. Tortuous and somewhat ectatic splenic artery. IVC filter appears satisfactorily positioned left common iliac node 0.6 cm in short axis on image 58/10. Right external iliac node 0.7 cm in short axis on image 75/10. No pathologic pelvic adenopathy is identified.  Reproductive: Small, dense appearing prostate gland with confluent calcifications centrally and posteriorly, and somewhat indistinct margins. In the prostate measures about 2.6 by 3.7 by 3.1 cm (volume = 16 cm^3). Fairly symmetric seminal vesicles.  Other: No supplemental non-categorized findings.  Musculoskeletal: Mild spurring of both sacroiliac joints. No compelling findings of osseous metastatic disease. Lower lumbar degenerative disc disease noted.  IMPRESSION: 1. Small somewhat dense appearing prostate gland with internal calcifications. No regional adenopathy or evidence of osseous metastatic disease. 2. Suspected fibrosis in the lung bases with some scattered mild scarring. 3. Aortic Atherosclerosis (ICD10-I70.0). Coronary atherosclerosis with mild cardiomegaly. 4. 2 mm calculus in the left eccentric bladder  diverticulum. Multiple bladder cellules. 5. Stable appearance of a large loop of sigmoid colon extending in a left-sided indirect inguinal hernia. Right indirect inguinal hernia contains adipose tissue. 6. Prominent stool throughout the colon favors constipation. Suspected fecal incontinence.   Electronically Signed   By: Van Clines M.D.   On: 04/23/2017 11:06     CLINICAL DATA:  Prostate cancer.  EXAM: NUCLEAR MEDICINE WHOLE BODY BONE SCAN  TECHNIQUE: Whole body anterior and posterior images were obtained approximately 3 hours after intravenous injection of radiopharmaceutical.  RADIOPHARMACEUTICALS:  25 mCi Technetium-16m MDP IV  COMPARISON:  CT abdomen pelvis 04/23/2017.  FINDINGS: No areas of abnormal radiotracer accumulation in the axial or appendicular skeleton.  IMPRESSION: No evidence of metastatic disease.   Electronically Signed   By: Lorin Picket M.D.   On: 04/23/2017 15:38  Results for JOSAIAH, MUHAMMED (MRN [id number]) as of 05/04/2017 21:09  Ref. Range 04/29/2017 15:11  Scan Result Unknown 240    I have independently reviewed the films.    Assessment &  Plan:    1. Elevated PSA   - explained to the patient and his daughter the findings on CT and bone scan  - explained to the patient and his daughter that the only reason for the elevation in his PSA is prostate cancer recurrence  -There is some confusion as to whether or not the prostate was removed in the past for prostate cancer treatment or the prostate cancer was treated with cryotherapy -findings on physical exam lead more towards the prostate being removed surgically (RRP).   -The findings on the CT scan of the bladder (2 mm calculus in the left eccentric bladder diverticulum. Multiple bladder cellules) would indicate an obstructive process that is contributing to the patient's lower urinary tract symptoms  -Obstructive processes are caused by bladder contractures or  urethral strictures which can happen after a radical prostatectomy or if there remnants of a prostate that are contributing to the obstruction -a cystoscopy may be able to give [place] more information regarding this issue, therefore I would like the patient undergo cystoscopy for further evaluation  - whether or not to pursue ADT therapy at this time is also a concern due to the risks of ADT therapy (heart disease, osteoporosis, hot flashes)  - our goal would be palliative treatment for his prostate cancer and it is unclear at this time whether or not a prostate is the cause for his lower urinary tract symptoms  2. Prostate cancer  - nodules palpated in the rectum  - PSA 32.5  3. Left inguinal hernia  - CT scan confirms the left inguinal hernia  4. LUTS  - IPSS score is 20/5  - Continue conservative management, avoiding bladder irritants and timed voiding's  - most bothersome symptoms is/are frequency, urgency, nocturia and incontinence  -I have asked the patient to hold his Myrbetriq at this time due to a possible risk of urinary retention and continue tamsulosin 0.4 mg daily  - schedule cystoscopy to evaluate for bladder neck contracture, urethral strictures or possible prostatic causation - if prostate tissue is noted, ADT therapy may help in treating his urinary symptoms   5. Incontinence  - see above  Return for cystoscopy .  These notes generated with voice recognition software. I apologize for typographical errors.  Zara Council, [facility] [address] [place], [place] [address] [phone]

## 2017-04-29 ENCOUNTER — Encounter: Payer: Self-pay | Admitting: Urology

## 2017-04-29 ENCOUNTER — Ambulatory Visit (INDEPENDENT_AMBULATORY_CARE_PROVIDER_SITE_OTHER): Payer: Medicare Other | Admitting: Urology

## 2017-04-29 VITALS — BP 151/80 | HR 73 | Ht 68.0 in | Wt 170.0 lb

## 2017-04-29 DIAGNOSIS — C61 Malignant neoplasm of prostate: Secondary | ICD-10-CM | POA: Diagnosis not present

## 2017-04-29 DIAGNOSIS — R399 Unspecified symptoms and signs involving the genitourinary system: Secondary | ICD-10-CM

## 2017-04-29 DIAGNOSIS — K4090 Unilateral inguinal hernia, without obstruction or gangrene, not specified as recurrent: Secondary | ICD-10-CM | POA: Diagnosis not present

## 2017-04-29 DIAGNOSIS — N39498 Other specified urinary incontinence: Secondary | ICD-10-CM | POA: Diagnosis not present

## 2017-04-29 DIAGNOSIS — R972 Elevated prostate specific antigen [PSA]: Secondary | ICD-10-CM

## 2017-04-29 DIAGNOSIS — Z8546 Personal history of malignant neoplasm of prostate: Secondary | ICD-10-CM | POA: Diagnosis not present

## 2017-04-29 LAB — BLADDER SCAN AMB NON-IMAGING: Scan Result: 240

## 2017-05-05 ENCOUNTER — Other Ambulatory Visit: Payer: Medicare Other | Admitting: Urology

## 2017-05-13 ENCOUNTER — Ambulatory Visit: Payer: Medicare Other | Admitting: Family Medicine

## 2017-05-13 VITALS — BP 144/72 | HR 66 | Temp 98.3°F | Resp 14 | Wt 171.4 lb

## 2017-05-13 DIAGNOSIS — E118 Type 2 diabetes mellitus with unspecified complications: Secondary | ICD-10-CM

## 2017-05-13 DIAGNOSIS — Z86711 Personal history of pulmonary embolism: Secondary | ICD-10-CM

## 2017-05-13 DIAGNOSIS — R413 Other amnesia: Secondary | ICD-10-CM

## 2017-05-13 DIAGNOSIS — R972 Elevated prostate specific antigen [PSA]: Secondary | ICD-10-CM | POA: Diagnosis not present

## 2017-05-13 DIAGNOSIS — N39498 Other specified urinary incontinence: Secondary | ICD-10-CM | POA: Diagnosis not present

## 2017-05-13 LAB — POCT GLYCOSYLATED HEMOGLOBIN (HGB A1C): HEMOGLOBIN A1C: 7.6

## 2017-05-13 NOTE — Progress Notes (Signed)
Tyler Kirk  MRN: [id number] DOB: December 31, 1919  Subjective:  HPI  Patient is here to follow up after seen Dr Vicente Masson last time on 04/16/17. And as of right now Dr Vicente Masson advised patient he is released from his care to [place] unless Dr [name] feels different about this at any point. On visit 04/16/17 Dr Vicente Masson noted DM, MCI stable off medication. Depression-noted it was positive score and that patient was intolerant of Celexa before, decided to follow for now and not add medication for this. History of PE-patient was advised to stop Warfarin and take Aspirin 81 mg daily. Last INR was checked on 03/21/17 and it was 2.9. For urinary incontinence patient was advised to try Myrbetriq but patient saw Zara Council on 04/29/17 and she put patient back on Flomax. Patient can not tell a difference in symptoms on either medication-which is better or not. Patient has urinary leakage, odor.   Labs: MetC, TSH and CBC checked on 12/26/16, Lipid on 08/24/15. Patient Active Problem List   Diagnosis Date Noted  . Elevated PSA 04/16/2017  . Urinary incontinence 02/14/2017  . Venous stasis of both lower extremities 02/14/2017  . Allergic rhinitis 02/14/2017  . Eczema 02/14/2017  . Gait instability 02/13/2017  . Microscopic hematuria 11/19/2016  . Arthritis 12/01/2014  . A-fib ([place]) 12/01/2014  . Benign fibroma of prostate 12/01/2014  . Cataract 12/01/2014  . Clinical depression 12/01/2014  . Esophagitis, reflux 12/01/2014  . Fatigue 12/01/2014  . Bilateral hearing loss 12/01/2014  . Hyperplasia, prostate 12/01/2014  . Mild cognitive impairment 12/01/2014  . Hx pulmonary embolism 12/01/2014  . Basal cell papilloma 12/01/2014  . Hernia, inguinal, unilateral 05/09/2006  . HLD (hyperlipidemia) 03/28/2006  . Diabetes mellitus, type 2 ([place]) 03/28/2006    Past Medical History:  Diagnosis Date  . Afib ([place])   . Arthritis   . BPH (benign prostatic hypertrophy)   . Cataracts, bilateral   . Depression     . Diabetes mellitus without complication ([place])   . GERD (gastroesophageal reflux disease)   . Hernia, inguinal    Chronic Large Inguinal Hernia  . Hyperlipidemia   . Hypertension   . Prostate cancer ([place])   . Pulmonary embolism ([place])   . UTI (urinary tract infection)     Social History   Socioeconomic History  . Marital status: Widowed    Spouse name: Not on file  . Number of children: Not on file  . Years of education: Not on file  . Highest education level: Not on file  Social Needs  . Financial resource strain: Not on file  . Food insecurity - worry: Not on file  . Food insecurity - inability: Not on file  . Transportation needs - medical: Not on file  . Transportation needs - non-medical: Not on file  Occupational History  . Not on file  Tobacco Use  . Smoking status: Never Smoker  . Smokeless tobacco: Never Used  Substance and Sexual Activity  . Alcohol use: No  . Drug use: No  . Sexual activity: Not on file  Other Topics Concern  . Not on file  Social History Narrative  . Not on file    Outpatient Encounter Medications as of 05/13/2017  Medication Sig Note  . aspirin 81 MG tablet Take 1 tablet (81 mg total) by mouth daily.   Marland Kitchen docusate sodium (COLACE) 250 MG capsule Take 250 mg by mouth daily as needed.   . fluticasone (FLONASE) 50 MCG/ACT nasal spray Place 2  sprays into the nose daily as needed. 04/01/2017: PRN  . glucose blood (ONE TOUCH ULTRA TEST) test strip 1 each by Other route every morning. Use as instructed Dx:E11.9   . mometasone (ELOCON) 0.1 % cream Apply 1 application topically 2 (two) times daily as needed.   . MULTIPLE VITAMIN PO Take 1 tablet by mouth daily. 12/01/2014: Received from: [facility]  . TAMSULOSIN HCL PO    . [DISCONTINUED] mirabegron ER (MYRBETRIQ) 25 MG TB24 tablet Take 1 tablet (25 mg total) by mouth daily. 05/13/2017: per urology go back on Flomax   No facility-administered encounter medications on file as of  05/13/2017.     Allergies  Allergen Reactions  . Celexa [Citalopram Hydrobromide]     diarrhea    Review of Systems  Constitutional: Positive for malaise/fatigue.  Respiratory: Negative.   Cardiovascular: Negative.   Gastrointestinal: Negative.   Genitourinary:       Urinary incontinence.  Musculoskeletal: Negative.   Neurological: Positive for weakness.  Endo/Heme/Allergies: Bruises/bleeds easily (better).    Objective:  BP (!) 144/72   Pulse 66   Temp 98.3 F (36.8 C)   Resp 14   Wt 171 lb 6.4 oz (77.7 kg)   BMI 26.06 kg/m   Physical Exam  Constitutional: He is oriented to person, place, and time and well-developed, well-nourished, and in no distress.  HENT:  Head: Normocephalic.  Eyes: Conjunctivae are normal. Pupils are equal, round, and reactive to light.  Cardiovascular: Normal rate, regular rhythm, normal heart sounds and intact distal pulses. Exam reveals no gallop.  No murmur heard. Pulmonary/Chest: Effort normal and breath sounds normal. No respiratory distress. He has no wheezes.  Abdominal: Soft.  Musculoskeletal: He exhibits edema (1+).  Neurological: He is alert and oriented to person, place, and time. Gait normal. GCS score is 15.  Skin: Skin is warm and dry.  Psychiatric: Mood, memory, affect and judgment normal.    Assessment and Plan :  1. Type 2 diabetes mellitus with complication, unspecified whether long term insulin use (HCC) 7.6 today. Worse. Stable at this time. Will follow for now. If above 8 will consider meds. - POCT HgB A1C  2. Memory loss/MCI Follow for now. 3. Other urinary incontinence On Flomax now. [place], PA. Discussed trying things for this issue like wearing pads, etc.  4. Elevated PSA Follows Zara Council, PA. Discussed this with patient and his daughter. Recent labs and CT scan and Bone scan that was done. Discussed bladder scope that was proposed but I think to just follow for now. Patient and daughter  agree with that plan, discussed risks vs benefits of the procedures.  5. Hx pulmonary embolism Off warfarin now. Continue Aspirin daily. Follow. 6. Fatigue Follow at this time. Patient advised no climbing anything especially ladders. Patient does mow his yard.  HPI, Exam and A&P transcribed by Tiffany Kocher, RMA under direction and in the presence of Miguel Aschoff, MD. I have done the exam and reviewed the chart and it is accurate to the best of my knowledge. Development worker, community has been used and  any errors in dictation or transcription are unintentional. Miguel Aschoff M.D. [facility]

## 2017-06-12 ENCOUNTER — Encounter: Payer: Self-pay | Admitting: Family Medicine

## 2017-06-12 ENCOUNTER — Ambulatory Visit (INDEPENDENT_AMBULATORY_CARE_PROVIDER_SITE_OTHER): Payer: Medicare Other | Admitting: Family Medicine

## 2017-06-12 VITALS — BP 132/80 | HR 88 | Temp 97.6°F | Resp 18 | Wt 172.0 lb

## 2017-06-12 DIAGNOSIS — I509 Heart failure, unspecified: Secondary | ICD-10-CM | POA: Diagnosis not present

## 2017-06-12 DIAGNOSIS — M549 Dorsalgia, unspecified: Secondary | ICD-10-CM

## 2017-06-12 DIAGNOSIS — R0602 Shortness of breath: Secondary | ICD-10-CM

## 2017-06-12 MED ORDER — HYDROCODONE-ACETAMINOPHEN 5-325 MG PO TABS: 0.5000 | ORAL_TABLET | Freq: Four times a day (QID) | ORAL | 0 refills | Status: DC | PRN

## 2017-06-12 MED ORDER — ISOSORBIDE MONONITRATE ER 30 MG PO TB24: 30.0000 mg | ORAL_TABLET | Freq: Every day | ORAL | 1 refills | Status: DC

## 2017-06-12 MED ORDER — FUROSEMIDE 20 MG PO TABS: 20.0000 mg | ORAL_TABLET | Freq: Every day | ORAL | 3 refills | Status: DC

## 2017-06-12 NOTE — Progress Notes (Signed)
Patient: Tyler Kirk Male    DOB: Mar 12, 1920   83 y.o.   MRN: [id number] Visit Date: 06/12/2017  Today's Provider: Wilhemena Durie, MD   Chief Complaint  Patient presents with  . Shortness of Breath   Subjective:    HPI  Pt is here for shortness of breath. He reports that he started having this about 3 weeks ago and is worsening. He also reports that he fell yesterday going out to get the paper. He reports that when ever he does the least amount of movement his back hurts. He is moving slower than he normally does and has a hard time getting out of a chair.    He is also very weak and family is concerned as he still lives alone at 83yo. The right back pain/flank pain is significant.      Allergies  Allergen Reactions  . Celexa [Citalopram Hydrobromide]     diarrhea     Current Outpatient Medications:  .  aspirin 81 MG tablet, Take 1 tablet (81 mg total) by mouth daily., Disp: 30 tablet, Rfl:  .  docusate sodium (COLACE) 250 MG capsule, Take 250 mg by mouth daily as needed., Disp: , Rfl:  .  fluticasone (FLONASE) 50 MCG/ACT nasal spray, Place 2 sprays into the nose daily as needed., Disp: , Rfl:  .  glucose blood (ONE TOUCH ULTRA TEST) test strip, 1 each by Other route every morning. Use as instructed Dx:E11.9, Disp: 100 each, Rfl: 12 .  mometasone (ELOCON) 0.1 % cream, Apply 1 application topically 2 (two) times daily as needed., Disp: 45 g, Rfl: 2 .  MULTIPLE VITAMIN PO, Take 1 tablet by mouth daily., Disp: , Rfl:  .  TAMSULOSIN HCL PO, , Disp: , Rfl:   Review of Systems  Constitutional: Positive for fatigue.  HENT: Negative.   Eyes: Negative.   Respiratory: Positive for shortness of breath.   Cardiovascular: Negative.   Gastrointestinal: Negative.   Endocrine: Negative.   Genitourinary: Negative.   Musculoskeletal: Positive for back pain.  Skin: Negative.   Allergic/Immunologic: Negative.   Neurological: Positive for weakness.    Psychiatric/Behavioral: Negative.     Social History   Tobacco Use  . Smoking status: Never Smoker  . Smokeless tobacco: Never Used  Substance Use Topics  . Alcohol use: No   Objective:   BP 132/80 (BP Location: Right Arm, Patient Position: Sitting, Cuff Size: Normal)   Pulse 88   Temp 97.6 F (36.4 C) (Oral)   Resp 18   Wt 172 lb (78 kg)   SpO2 96%   BMI 26.15 kg/m  Vitals:   06/12/17 1612  BP: 132/80  Pulse: 88  Resp: 18  Temp: 97.6 F (36.4 C)  TempSrc: Oral  SpO2: 96%  Weight: 172 lb (78 kg)     Physical Exam  Constitutional: He is oriented to person, place, and time. He appears well-developed and well-nourished.  Elderly WM who is uncomfortable sitting in chair due to back pain.  HENT:  Head: Normocephalic and atraumatic.  Right Ear: External ear normal.  Left Ear: External ear normal.  Nose: Nose normal.  Eyes: Conjunctivae are normal. No scleral icterus.  Neck: No thyromegaly present.  Cardiovascular: Normal rate, regular rhythm and normal heart sounds.  Pulmonary/Chest: Effort normal and breath sounds normal.  Abdominal: Soft.  Musculoskeletal: He exhibits no edema.  Lymphadenopathy:    He has no cervical adenopathy.  Neurological: He is alert and oriented  to person, place, and time.  Skin: Skin is warm and dry.  Psychiatric: He has a normal mood and affect. His behavior is normal. Judgment and thought content normal.        Assessment & Plan:     DOE Possible CHF--Rx lasix pending CXR and labs Possible Angina Isordil 30mg  daily. Fall with Back Pain Possible rib fracture--doubt vertebral fx. Possible pneumonia CXR. More than 30 minutes spent on visit --more than 50% counseling At 89 may have to consider Hospice in future.     I have done the exam and reviewed the above chart and it is accurate to the best of my knowledge. Development worker, community has been used in this note in any air is in the dictation or transcription are  unintentional.  Wilhemena Durie, MD  [place]

## 2017-06-13 ENCOUNTER — Emergency Department: Payer: Medicare Other

## 2017-06-13 ENCOUNTER — Encounter: Payer: Self-pay | Admitting: Emergency Medicine

## 2017-06-13 ENCOUNTER — Emergency Department
Admission: EM | Admit: 2017-06-13 | Discharge: 2017-06-13 | Disposition: A | Discharge status: Home / Self Care | Payer: Medicare Other | Source: Home / Self Care | Attending: Emergency Medicine | Admitting: Emergency Medicine

## 2017-06-13 ENCOUNTER — Observation Stay
Admission: EM | Admit: 2017-06-13 | Discharge: 2017-06-15 | Disposition: A | Discharge status: Skilled Nursing Facility | Payer: Medicare Other | Attending: Internal Medicine | Admitting: Internal Medicine

## 2017-06-13 ENCOUNTER — Other Ambulatory Visit: Payer: Self-pay

## 2017-06-13 DIAGNOSIS — I119 Hypertensive heart disease without heart failure: Secondary | ICD-10-CM | POA: Diagnosis not present

## 2017-06-13 DIAGNOSIS — E860 Dehydration: Secondary | ICD-10-CM | POA: Diagnosis not present

## 2017-06-13 DIAGNOSIS — R739 Hyperglycemia, unspecified: Secondary | ICD-10-CM | POA: Diagnosis present

## 2017-06-13 DIAGNOSIS — I10 Essential (primary) hypertension: Secondary | ICD-10-CM

## 2017-06-13 DIAGNOSIS — E1165 Type 2 diabetes mellitus with hyperglycemia: Secondary | ICD-10-CM | POA: Diagnosis not present

## 2017-06-13 DIAGNOSIS — E1151 Type 2 diabetes mellitus with diabetic peripheral angiopathy without gangrene: Secondary | ICD-10-CM | POA: Insufficient documentation

## 2017-06-13 DIAGNOSIS — Z7982 Long term (current) use of aspirin: Secondary | ICD-10-CM | POA: Diagnosis not present

## 2017-06-13 DIAGNOSIS — F329 Major depressive disorder, single episode, unspecified: Secondary | ICD-10-CM

## 2017-06-13 DIAGNOSIS — R0600 Dyspnea, unspecified: Secondary | ICD-10-CM

## 2017-06-13 DIAGNOSIS — S80812A Abrasion, left lower leg, initial encounter: Secondary | ICD-10-CM | POA: Insufficient documentation

## 2017-06-13 DIAGNOSIS — N179 Acute kidney failure, unspecified: Secondary | ICD-10-CM | POA: Diagnosis not present

## 2017-06-13 DIAGNOSIS — Z66 Do not resuscitate: Secondary | ICD-10-CM | POA: Diagnosis not present

## 2017-06-13 DIAGNOSIS — Z8546 Personal history of malignant neoplasm of prostate: Secondary | ICD-10-CM | POA: Diagnosis not present

## 2017-06-13 DIAGNOSIS — Z23 Encounter for immunization: Secondary | ICD-10-CM | POA: Diagnosis not present

## 2017-06-13 DIAGNOSIS — K219 Gastro-esophageal reflux disease without esophagitis: Secondary | ICD-10-CM | POA: Insufficient documentation

## 2017-06-13 DIAGNOSIS — Z86711 Personal history of pulmonary embolism: Secondary | ICD-10-CM | POA: Diagnosis not present

## 2017-06-13 DIAGNOSIS — S20211A Contusion of right front wall of thorax, initial encounter: Secondary | ICD-10-CM

## 2017-06-13 DIAGNOSIS — I480 Paroxysmal atrial fibrillation: Secondary | ICD-10-CM | POA: Diagnosis not present

## 2017-06-13 DIAGNOSIS — Y929 Unspecified place or not applicable: Secondary | ICD-10-CM

## 2017-06-13 DIAGNOSIS — E119 Type 2 diabetes mellitus without complications: Secondary | ICD-10-CM | POA: Insufficient documentation

## 2017-06-13 DIAGNOSIS — M25551 Pain in right hip: Secondary | ICD-10-CM | POA: Diagnosis present

## 2017-06-13 DIAGNOSIS — R2681 Unsteadiness on feet: Secondary | ICD-10-CM | POA: Insufficient documentation

## 2017-06-13 DIAGNOSIS — W010XXA Fall on same level from slipping, tripping and stumbling without subsequent striking against object, initial encounter: Secondary | ICD-10-CM

## 2017-06-13 DIAGNOSIS — Z79899 Other long term (current) drug therapy: Secondary | ICD-10-CM | POA: Diagnosis not present

## 2017-06-13 DIAGNOSIS — Y998 Other external cause status: Secondary | ICD-10-CM | POA: Insufficient documentation

## 2017-06-13 DIAGNOSIS — N400 Enlarged prostate without lower urinary tract symptoms: Secondary | ICD-10-CM | POA: Diagnosis not present

## 2017-06-13 DIAGNOSIS — I700 Atherosclerosis of aorta: Secondary | ICD-10-CM | POA: Diagnosis not present

## 2017-06-13 DIAGNOSIS — E785 Hyperlipidemia, unspecified: Secondary | ICD-10-CM | POA: Insufficient documentation

## 2017-06-13 DIAGNOSIS — Y92009 Unspecified place in unspecified non-institutional (private) residence as the place of occurrence of the external cause: Secondary | ICD-10-CM | POA: Insufficient documentation

## 2017-06-13 DIAGNOSIS — Y9389 Activity, other specified: Secondary | ICD-10-CM | POA: Insufficient documentation

## 2017-06-13 DIAGNOSIS — M549 Dorsalgia, unspecified: Secondary | ICD-10-CM

## 2017-06-13 DIAGNOSIS — R55 Syncope and collapse: Secondary | ICD-10-CM | POA: Diagnosis not present

## 2017-06-13 DIAGNOSIS — W19XXXA Unspecified fall, initial encounter: Secondary | ICD-10-CM | POA: Insufficient documentation

## 2017-06-13 LAB — URINALYSIS, COMPLETE (UACMP) WITH MICROSCOPIC
Bilirubin Urine: NEGATIVE
GLUCOSE, UA: NEGATIVE mg/dL
Hgb urine dipstick: NEGATIVE
KETONES UR: 5 mg/dL — AB
Nitrite: NEGATIVE
PH: 5 (ref 5.0–8.0)
Protein, ur: NEGATIVE mg/dL
Specific Gravity, Urine: >1.046 — ABNORMAL HIGH (ref 1.005–1.030)

## 2017-06-13 LAB — CBC WITH DIFFERENTIAL/PLATELET
BASOS PCT: 1.3 %
Basophils Absolute: 111 cells/uL (ref 0–200)
Basophils Absolute: 0.1 K/uL (ref 0–0.1)
Basophils Relative: 1 %
Eosinophils Absolute: 884 cells/uL — ABNORMAL HIGH (ref 15–500)
Eosinophils Absolute: 1.1 K/uL — ABNORMAL HIGH (ref 0–0.7)
Eosinophils Relative: 10.4 %
Eosinophils Relative: 11 %
HCT: 46.6 % (ref 38.5–50.0)
HCT: 44.8 % (ref 40.0–52.0)
Hemoglobin: 15.7 g/dL (ref 13.2–17.1)
Hemoglobin: 14.7 g/dL (ref 13.0–18.0)
Lymphocytes Relative: 15 %
Lymphs Abs: 1165 cells/uL (ref 850–3900)
Lymphs Abs: 1.5 K/uL (ref 1.0–3.6)
MCH: 28.5 pg (ref 27.0–33.0)
MCH: 28.7 pg (ref 26.0–34.0)
MCHC: 33.7 g/dL (ref 32.0–36.0)
MCHC: 32.9 g/dL (ref 32.0–36.0)
MCV: 84.6 fL (ref 80.0–100.0)
MCV: 87.4 fL (ref 80.0–100.0)
MONOS PCT: 12.7 %
MPV: 9.8 fL (ref 7.5–12.5)
Monocytes Absolute: 1 K/uL (ref 0.2–1.0)
Monocytes Relative: 10 %
NEUTROS PCT: 61.9 %
Neutro Abs: 5262 cells/uL (ref 1500–7800)
Neutro Abs: 6.5 K/uL (ref 1.4–6.5)
Neutrophils Relative %: 63 %
PLATELETS: 307 10*3/uL (ref 140–400)
Platelets: 268 K/uL (ref 150–440)
RBC: 5.51 10*6/uL (ref 4.20–5.80)
RBC: 5.13 MIL/uL (ref 4.40–5.90)
RDW: 12.4 % (ref 11.0–15.0)
RDW: 13.7 % (ref 11.5–14.5)
TOTAL LYMPHOCYTE: 13.7 %
WBC mixed population: 1080 cells/uL — ABNORMAL HIGH (ref 200–950)
WBC: 8.5 10*3/uL (ref 3.8–10.8)
WBC: 10.2 K/uL (ref 3.8–10.6)

## 2017-06-13 LAB — COMPREHENSIVE METABOLIC PANEL
ALBUMIN: 3.5 g/dL (ref 3.5–5.0)
ALK PHOS: 74 U/L (ref 38–126)
ALT: 23 U/L (ref 17–63)
AST: 35 U/L (ref 15–41)
Anion gap: 9 (ref 5–15)
BUN: 31 mg/dL — AB (ref 6–20)
CALCIUM: 9.1 mg/dL (ref 8.9–10.3)
CO2: 24 mmol/L (ref 22–32)
CREATININE: 1.04 mg/dL (ref 0.61–1.24)
Chloride: 106 mmol/L (ref 101–111)
GFR calc Af Amer: >60 mL/min (ref >60)
GFR calc non Af Amer: 59 mL/min — ABNORMAL LOW (ref >60)
GLUCOSE: 251 mg/dL — AB (ref 65–99)
Potassium: 4.3 mmol/L (ref 3.5–5.1)
SODIUM: 139 mmol/L (ref 135–145)
Total Bilirubin: 0.8 mg/dL (ref 0.3–1.2)
Total Protein: 6.9 g/dL (ref 6.5–8.1)

## 2017-06-13 LAB — BASIC METABOLIC PANEL WITH GFR
Anion gap: 8 (ref 5–15)
BUN: 30 mg/dL — ABNORMAL HIGH (ref 6–20)
CO2: 25 mmol/L (ref 22–32)
Calcium: 9.1 mg/dL (ref 8.9–10.3)
Chloride: 106 mmol/L (ref 101–111)
Creatinine, Ser: 0.86 mg/dL (ref 0.61–1.24)
GFR calc Af Amer: >60 mL/min
GFR calc non Af Amer: >60 mL/min
Glucose, Bld: 241 mg/dL — ABNORMAL HIGH (ref 65–99)
Potassium: 4 mmol/L (ref 3.5–5.1)
Sodium: 139 mmol/L (ref 135–145)

## 2017-06-13 LAB — APTT: aPTT: 38 s — ABNORMAL HIGH (ref 24–36)

## 2017-06-13 LAB — COMPLETE METABOLIC PANEL WITH GFR
AG Ratio: 1.2 (calc) (ref 1.0–2.5)
ALT: 20 U/L (ref 9–46)
AST: 28 U/L (ref 10–35)
Albumin: 3.7 g/dL (ref 3.6–5.1)
Alkaline phosphatase (APISO): 82 U/L (ref 40–115)
BILIRUBIN TOTAL: 0.7 mg/dL (ref 0.2–1.2)
BUN/Creatinine Ratio: 28 (calc) — ABNORMAL HIGH (ref 6–22)
BUN: 27 mg/dL — AB (ref 7–25)
CO2: 27 mmol/L (ref 20–32)
Calcium: 9.5 mg/dL (ref 8.6–10.3)
Chloride: 104 mmol/L (ref 98–110)
Creat: 0.96 mg/dL (ref 0.70–1.11)
GFR, EST AFRICAN AMERICAN: 78 mL/min/{1.73_m2} (ref >=60)
GFR, EST NON AFRICAN AMERICAN: 67 mL/min/{1.73_m2} (ref >=60)
GLUCOSE: 199 mg/dL — AB (ref 65–99)
Globulin: 3.2 g/dL (calc) (ref 1.9–3.7)
POTASSIUM: 4.7 mmol/L (ref 3.5–5.3)
Sodium: 140 mmol/L (ref 135–146)
TOTAL PROTEIN: 6.9 g/dL (ref 6.1–8.1)

## 2017-06-13 LAB — TROPONIN I: Troponin I: 0.01 ng/mL (ref <=0.0)

## 2017-06-13 LAB — CBC
HEMATOCRIT: 43.4 % (ref 40.0–52.0)
HEMOGLOBIN: 14.2 g/dL (ref 13.0–18.0)
MCH: 28.8 pg (ref 26.0–34.0)
MCHC: 32.7 g/dL (ref 32.0–36.0)
MCV: 88.2 fL (ref 80.0–100.0)
Platelets: 252 10*3/uL (ref 150–440)
RBC: 4.92 MIL/uL (ref 4.40–5.90)
RDW: 13.8 % (ref 11.5–14.5)
WBC: 10.4 10*3/uL (ref 3.8–10.6)

## 2017-06-13 LAB — BRAIN NATRIURETIC PEPTIDE
B NATRIURETIC PEPTIDE 5: 35 pg/mL (ref 0.0–100.0)
BRAIN NATRIURETIC PEPTIDE: 49 pg/mL (ref <100)

## 2017-06-13 LAB — GLUCOSE, CAPILLARY
Glucose-Capillary: 209 mg/dL — ABNORMAL HIGH (ref 65–99)
Glucose-Capillary: 199 mg/dL — ABNORMAL HIGH (ref 65–99)

## 2017-06-13 LAB — PROTIME-INR
INR: 1.19
Prothrombin Time: 15 s (ref 11.4–15.2)

## 2017-06-13 LAB — INFLUENZA PANEL BY PCR (TYPE A & B)
Influenza A By PCR: NEGATIVE
Influenza B By PCR: NEGATIVE

## 2017-06-13 MED ORDER — ACETAMINOPHEN 325 MG PO TABS
650.0000 mg | ORAL_TABLET | Freq: Four times a day (QID) | ORAL | Status: DC | PRN
  Administered 2017-06-14: 650 mg via ORAL
  Filled 2017-06-13: qty 2

## 2017-06-13 MED ORDER — SODIUM CHLORIDE 0.9 % IV BOLUS (SEPSIS)
250.0000 mL | Freq: Once | INTRAVENOUS | Status: AC
  Administered 2017-06-13: 250 mL via INTRAVENOUS

## 2017-06-13 MED ORDER — SENNOSIDES-DOCUSATE SODIUM 8.6-50 MG PO TABS: 1.0000 | ORAL_TABLET | Freq: Every evening | ORAL | Status: DC | PRN

## 2017-06-13 MED ORDER — ALBUTEROL SULFATE (2.5 MG/3ML) 0.083% IN NEBU: 2.5000 mg | INHALATION_SOLUTION | RESPIRATORY_TRACT | Status: DC | PRN

## 2017-06-13 MED ORDER — IOPAMIDOL (ISOVUE-370) INJECTION 76%
75.0000 mL | Freq: Once | INTRAVENOUS | Status: AC | PRN
  Administered 2017-06-13: 75 mL via INTRAVENOUS

## 2017-06-13 MED ORDER — ONDANSETRON HCL 4 MG/2ML IJ SOLN: 4.0000 mg | Freq: Four times a day (QID) | INTRAMUSCULAR | Status: DC | PRN

## 2017-06-13 MED ORDER — ACETAMINOPHEN 325 MG PO TABS
650.0000 mg | ORAL_TABLET | Freq: Once | ORAL | Status: AC
  Administered 2017-06-13: 650 mg via ORAL
  Filled 2017-06-13: qty 2

## 2017-06-13 MED ORDER — ACETAMINOPHEN 650 MG RE SUPP: 650.0000 mg | Freq: Four times a day (QID) | RECTAL | Status: DC | PRN

## 2017-06-13 MED ORDER — INSULIN ASPART 100 UNIT/ML ~~LOC~~ SOLN
0.0000 [IU] | Freq: Three times a day (TID) | SUBCUTANEOUS | Status: DC
  Administered 2017-06-14 (×2): 2 [IU] via SUBCUTANEOUS
  Administered 2017-06-14: 3 [IU] via SUBCUTANEOUS
  Administered 2017-06-15: 2 [IU] via SUBCUTANEOUS
  Administered 2017-06-15: 3 [IU] via SUBCUTANEOUS
  Filled 2017-06-13 (×5): qty 1

## 2017-06-13 MED ORDER — ENOXAPARIN SODIUM 40 MG/0.4ML ~~LOC~~ SOLN
40.0000 mg | SUBCUTANEOUS | Status: DC
  Administered 2017-06-14: 40 mg via SUBCUTANEOUS
  Filled 2017-06-13 (×2): qty 0.4

## 2017-06-13 MED ORDER — BISACODYL 5 MG PO TBEC: 5.0000 mg | DELAYED_RELEASE_TABLET | Freq: Every day | ORAL | Status: DC | PRN

## 2017-06-13 MED ORDER — ONDANSETRON HCL 4 MG PO TABS: 4.0000 mg | ORAL_TABLET | Freq: Four times a day (QID) | ORAL | Status: DC | PRN

## 2017-06-13 MED ORDER — INSULIN ASPART 100 UNIT/ML ~~LOC~~ SOLN: 0.0000 [IU] | Freq: Every day | SUBCUTANEOUS | Status: DC

## 2017-06-13 MED ORDER — ASPIRIN EC 81 MG PO TBEC
81.0000 mg | DELAYED_RELEASE_TABLET | Freq: Every day | ORAL | Status: DC
  Administered 2017-06-14 – 2017-06-15 (×2): 81 mg via ORAL
  Filled 2017-06-13 (×2): qty 1

## 2017-06-13 MED ORDER — SODIUM CHLORIDE 0.9 % IV BOLUS (SEPSIS)
1000.0000 mL | Freq: Once | INTRAVENOUS | Status: AC
  Administered 2017-06-13: 1000 mL via INTRAVENOUS

## 2017-06-13 MED ORDER — DOCUSATE SODIUM 100 MG PO CAPS: 200.0000 mg | ORAL_CAPSULE | Freq: Every day | ORAL | Status: DC | PRN

## 2017-06-13 MED ORDER — TETANUS-DIPHTH-ACELL PERTUSSIS 5-2.5-18.5 LF-MCG/0.5 IM SUSP
0.5000 mL | Freq: Once | INTRAMUSCULAR | Status: AC
Start: 2017-06-13 — End: 2017-06-13
  Administered 2017-06-13: 0.5 mL via INTRAMUSCULAR
  Filled 2017-06-13: qty 0.5

## 2017-06-13 MED ORDER — TAMSULOSIN HCL 0.4 MG PO CAPS
0.4000 mg | ORAL_CAPSULE | Freq: Every day | ORAL | Status: DC
  Administered 2017-06-14 – 2017-06-15 (×2): 0.4 mg via ORAL
  Filled 2017-06-13 (×2): qty 1

## 2017-06-13 MED ORDER — SODIUM CHLORIDE 0.9 % IV SOLN
INTRAVENOUS | Status: DC
  Administered 2017-06-13: 22:00:00 via INTRAVENOUS

## 2017-06-13 NOTE — ED Provider Notes (Signed)
[facility] Emergency Department Provider Note       Time seen: ----------------------------------------- 12:26 PM on 06/13/2017 -----------------------------------------   I have reviewed the triage vital signs and the nursing notes.  HISTORY   Chief Complaint Fall and Shortness of Breath    HPI Tyler Kirk is a 83 y.o. male with a history of A. fib, depression, diabetes, GERD, hypertension and hyperlipidemia who presents to the ED for shortness of breath and right-sided pain.  Patient reports he fell in the snow 2 days ago but he was also seen by his primary care doctor yesterday reports pain with coughing.  He has not had any x-rays or further imaging.  Pain is moderate to severe in the right side.  Past Medical History:  Diagnosis Date  . Afib ([place])   . Arthritis   . BPH (benign prostatic hypertrophy)   . Cataracts, bilateral   . Depression   . Diabetes mellitus without complication ([place])   . GERD (gastroesophageal reflux disease)   . Hernia, inguinal    Chronic Large Inguinal Hernia  . Hyperlipidemia   . Hypertension   . Prostate cancer ([place])   . Pulmonary embolism ([place])   . UTI (urinary tract infection)     Patient Active Problem List   Diagnosis Date Noted  . Elevated PSA 04/16/2017  . Urinary incontinence 02/14/2017  . Venous stasis of both lower extremities 02/14/2017  . Allergic rhinitis 02/14/2017  . Eczema 02/14/2017  . Gait instability 02/13/2017  . Microscopic hematuria 11/19/2016  . Arthritis 12/01/2014  . A-fib ([place]) 12/01/2014  . Benign fibroma of prostate 12/01/2014  . Cataract 12/01/2014  . Clinical depression 12/01/2014  . Esophagitis, reflux 12/01/2014  . Fatigue 12/01/2014  . Bilateral hearing loss 12/01/2014  . Hyperplasia, prostate 12/01/2014  . Mild cognitive impairment 12/01/2014  . Hx pulmonary embolism 12/01/2014  . Basal cell papilloma 12/01/2014  . Hernia, inguinal, unilateral 05/09/2006  . HLD  (hyperlipidemia) 03/28/2006  . Diabetes mellitus, type 2 ([place]) 03/28/2006    Past Surgical History:  Procedure Laterality Date  . bladder diverticulum removed  1960  . EYE SURGERY     catarct  . PROSTATE SURGERY     prostate removed  . skin cancer: Removed  Left    Left arm near wrist area  . TOOTH EXTRACTION      Allergies Celexa [citalopram hydrobromide]  Social History Social History   Tobacco Use  . Smoking status: Never Smoker  . Smokeless tobacco: Never Used  Substance Use Topics  . Alcohol use: No  . Drug use: No    Review of Systems Constitutional: Negative for fever. Eyes: Negative for vision changes ENT:  Negative for congestion, sore throat Cardiovascular: Positive for chest pain Respiratory: Positive for shortness of breath and cough Gastrointestinal: Negative for abdominal pain, vomiting and diarrhea. Musculoskeletal: Negative for back pain. Skin: Negative for rash. Neurological: Negative for headaches, focal weakness or numbness.  All systems negative/normal/unremarkable except as stated in the HPI  ____________________________________________   PHYSICAL EXAM:  VITAL SIGNS: ED Triage Vitals  Enc Vitals Group     BP 06/13/17 1211 111/79     Pulse Rate 06/13/17 1211 94     Resp 06/13/17 1211 18     Temp 06/13/17 1211 98.2 F (36.8 C)     Temp Source 06/13/17 1211 Oral     SpO2 06/13/17 1211 93 %     Weight 06/13/17 1209 178 lb (80.7 kg)  Height 06/13/17 1209 5\' 8"  (1.727 m)     Head Circumference --      Peak Flow --      Pain Score --      Pain Loc --      Pain Edu? --      Excl. in [place]? --     Constitutional: Alert and oriented. Well appearing and in no distress. Eyes: Conjunctivae are normal. Normal extraocular movements. ENT   Head: Normocephalic and atraumatic.   Nose: No congestion/rhinnorhea.   Mouth/Throat: Mucous membranes are moist.   Neck: No stridor. Cardiovascular: Normal rate, regular rhythm. No  murmurs, rubs, or gallops. Respiratory: Normal respiratory effort without tachypnea nor retractions.  Crackles are auscultated on the right Gastrointestinal: Soft and nontender. Normal bowel sounds Musculoskeletal: Nontender with normal range of motion in extremities. No lower extremity tenderness nor edema. Neurologic:  Normal speech and language. No gross focal neurologic deficits are appreciated.  Skin:  Skin is warm, dry and intact. No rash noted. Psychiatric: Mood and affect are normal. Speech and behavior are normal.  ____________________________________________  EKG: Interpreted by me.  Sinus rhythm rate of 92 bpm, normal PR interval, normal QRS size, left anterior fascicular block  ____________________________________________  ED COURSE:  Pertinent labs & imaging results that were available during my care of the patient were reviewed by me and considered in my medical decision making (see chart for details). Patient presents for dyspnea and right-sided chest pain, we will assess with labs and imaging as indicated. Clinical Course as of Jun 13 1449  Fri Jun 13, 2017  1327 Persistent right upper lobe opacity.  We will obtain CT imaging.  [JW]    Clinical Course User Index [JW] Earleen Newport, MD   Procedures ____________________________________________   LABS (pertinent positives/negatives)  Labs Reviewed  CBC WITH DIFFERENTIAL/PLATELET - Abnormal; Notable for the following components:      Result Value   Eosinophils Absolute 1.1 (*)    All other components within normal limits  BASIC METABOLIC PANEL - Abnormal; Notable for the following components:   Glucose, Bld 241 (*)    BUN 30 (*)    All other components within normal limits  INFLUENZA PANEL BY PCR (TYPE A & B)  BRAIN NATRIURETIC PEPTIDE  TROPONIN I    RADIOLOGY Images were viewed by me  Chest x-ray/CTA  IMPRESSION: Since 2012, significant interval progression of right upper lobe reticular opacity.  Although this is not a typical appearance of acute pneumonia, this cannot be excluded. Followup PA and lateral chest X-ray is recommended in 3-4 weeks following trial of antibiotic therapy to ensure resolution and exclude underlying malignancy.  Cardiomegaly without congestive failure.  Aortic and branch vessel atherosclerosis. IMPRESSION: 1. No CT findings for pulmonary embolism. 2. Enlarged pulmonary arteries suggesting pulmonary hypertension. 3. Mosaic pattern of lung attenuation. Please see above discussion. 4. No focal infiltrates or effusions. 5. Intact bony thorax.  Aortic Atherosclerosis (ICD10-I70.0). ____________________________________________  DIFFERENTIAL DIAGNOSIS   Pneumonia, PE, pneumothorax, rib contusion, muscle strain, MI  FINAL ASSESSMENT AND PLAN  Rib contusion, shortness of breath   Plan: Patient had presented for right-sided chest pain and shortness of breath. Patient's labs revealed mild dehydration for which she was given IV fluids. Patient's imaging initially was concerning for a right upper lobe opacity but nothing specific when viewed on CT.  Shortness of breath is probably coming from pulmonary hypertension which I discussed with the family.  He is having right-sided rib pain from a fall  for which she is taking Vicodin.  Overall he is stable for outpatient follow-up.   Earleen Newport, MD   Note: This note was generated in part or whole with voice recognition software. Voice recognition is usually quite accurate but there are transcription errors that can and very often do occur. I apologize for any typographical errors that were not detected and corrected.     Earleen Newport, MD 06/13/17 [phone]

## 2017-06-13 NOTE — ED Triage Notes (Signed)
Patient arrived via EMS, found by same down on his face outside and down for about 15 minutes per family/EMS.  Pt states he does not remember falling, and states he has no pain.  Pt appears to have a leg knee impact (dirty pajamas) from fall, and abrasion on his left shin.  Patient is disoriented to place.  Patient was just released today from ED , he was tx for Fall/SOB.  VS WNL per EMS.

## 2017-06-13 NOTE — ED Triage Notes (Signed)
Pt arrived via EMS from home for reports of shortness of breath, and right side pain. Pt reports fell in the snow two days ago. Pt seen by PCP yesterday. Pt reports pain with coughing. EMS reports VSS. Pt alert and oriented on arrival.

## 2017-06-13 NOTE — ED Notes (Signed)
Patient transported to X-ray 

## 2017-06-13 NOTE — ED Notes (Signed)
Given water. Ok to eat sandwich from daughter per dr Jimmye Norman.

## 2017-06-13 NOTE — ED Notes (Signed)
David RN, aware of bed assigned  

## 2017-06-13 NOTE — ED Notes (Signed)
Family at bedside. 

## 2017-06-13 NOTE — ED Provider Notes (Signed)
[facility] Emergency Department Provider Note  ____________________________________________  Time seen: Approximately 4:30 PM  I have reviewed the triage vital signs and the nursing notes.   HISTORY  Chief Complaint Loss of Consciousness  The patient's history is limited due to his altered mental status. HPI Tyler Kirk is a 83 y.o. male discharged from the emergency department less than 2 hours ago after being evaluated for fall and shortness of breath presenting with syncope.  The patient does not remember what happened and is unable to give a history.  Per report, the patient went home, and was found face down on the ground and unresponsive by family member after what was approximated to be 15 minutes.  The patient does not remember any of this.  At this time, the patient is complaining of right upper hip pain.  He denies any headache, nausea or vomiting, chest pain, palpitations.  On arrival to the emergency department, the patient has reassuring vital signs and is protecting his airway.  He has obvious at least superficial injury to the left knee and tibia.  Past Medical History:  Diagnosis Date  . Afib ([place])   . Arthritis   . BPH (benign prostatic hypertrophy)   . Cataracts, bilateral   . Depression   . Diabetes mellitus without complication ([place])   . GERD (gastroesophageal reflux disease)   . Hernia, inguinal    Chronic Large Inguinal Hernia  . Hyperlipidemia   . Hypertension   . Prostate cancer ([place])   . Pulmonary embolism ([place])   . UTI (urinary tract infection)     Patient Active Problem List   Diagnosis Date Noted  . Elevated PSA 04/16/2017  . Urinary incontinence 02/14/2017  . Venous stasis of both lower extremities 02/14/2017  . Allergic rhinitis 02/14/2017  . Eczema 02/14/2017  . Gait instability 02/13/2017  . Microscopic hematuria 11/19/2016  . Arthritis 12/01/2014  . A-fib ([place]) 12/01/2014  . Benign fibroma of prostate  12/01/2014  . Cataract 12/01/2014  . Clinical depression 12/01/2014  . Esophagitis, reflux 12/01/2014  . Fatigue 12/01/2014  . Bilateral hearing loss 12/01/2014  . Hyperplasia, prostate 12/01/2014  . Mild cognitive impairment 12/01/2014  . Hx pulmonary embolism 12/01/2014  . Basal cell papilloma 12/01/2014  . Hernia, inguinal, unilateral 05/09/2006  . HLD (hyperlipidemia) 03/28/2006  . Diabetes mellitus, type 2 ([place]) 03/28/2006    Past Surgical History:  Procedure Laterality Date  . bladder diverticulum removed  1960  . EYE SURGERY     catarct  . PROSTATE SURGERY     prostate removed  . skin cancer: Removed  Left    Left arm near wrist area  . TOOTH EXTRACTION      Current Outpatient Rx  . Order #: [id number] Class: OTC  . Order #: [id number] Class: Historical Med  . Order #: [id number] Class: Historical Med  . Order #: [id number] Class: Normal  . Order #: [id number] Class: Normal  . Order #: [id number] Class: Print  . Order #: [id number] Class: Normal  . Order #: [id number] Class: Normal  . Order #: [id number] Class: Historical Med  . Order #: [id number] Class: Historical Med    Allergies Celexa [citalopram hydrobromide]  Family History  Problem Relation Age of Onset  . Stroke Mother   . Hypertension Mother   . Stroke Father   . Prostate cancer Neg Hx   . Kidney cancer Neg Hx   . Bladder Cancer Neg Hx     Social History Social History   Tobacco Use  . Smoking status:  Never Smoker  . Smokeless tobacco: Never Used  Substance Use Topics  . Alcohol use: No  . Drug use: No    Review of Systems Unable to complete due to patient altered mental status, but the patient does report pain in the right hip.  ____________________________________________   PHYSICAL EXAM:  VITAL SIGNS: ED Triage Vitals [06/13/17 1615]  Enc Vitals Group     BP 116/79     Pulse Rate 76     Resp 18     Temp 98.6 F (37 C)     Temp Source Oral     SpO2 94 %     Weight      Height       Head Circumference      Peak Flow      Pain Score      Pain Loc      Pain Edu?      Excl. in [place]?     Constitutional: Patient is alert and knows the year but not the month.  His speech is clear and he is comfortable appearing.  GCS is 15. Eyes: Conjunctivae are normal.  EOMI. PERRLA.  No scleral icterus.  No raccoon eyes. Head: Atraumatic.  No battle sign. Nose: No congestion/rhinnorhea.  No swelling or bruising over the nose.  No septal hematoma.   Mouth/Throat: Mucous membranes are dry.  No malocclusion or dental injury..  Neck: No stridor.  Supple.  No midline C-spine tenderness to palpation, step-offs or deformities. Cardiovascular: Normal rate, regular rhythm. No murmurs, rubs or gallops.  Respiratory: Normal respiratory effort.  No accessory muscle use or retractions. Lungs CTAB.  No wheezes, rales or ronchi. Gastrointestinal: Soft, nontender and nondistended.  No guarding or rebound.  No peritoneal signs. GU: Urinary incontinence. Musculoskeletal: Pelvis is stable.  The patient has pain with range of motion of the right hip and palpation over the iliac crest but not over the greater trochanter.  No T or L-spine tenderness to palpation, step-offs or deformities.  Full range of motion of the bilateral shoulders, elbows, wrists, left hip, bilateral knees, and bilateral ankles without pain.  The patient has several skin tears just below the patella and over the left tibial shaft.  No LE edema. No ttp in the calves or palpable cords.  Negative Homan's sign. Neurologic: alert.  Speech is clear.  Face and smile are symmetric.  EOMI. PERRLA.  Moves all extremities well. Skin:  Skin is warm, dry and intact. No rash noted. Psychiatric: Mood and affect are normal.  ____________________________________________   LABS (all labs ordered are listed, but only abnormal results are displayed)  Labs Reviewed  COMPREHENSIVE METABOLIC PANEL - Abnormal; Notable for the following components:      Result  Value   Glucose, Bld 251 (*)    BUN 31 (*)    GFR calc non Af Amer 59 (*)    All other components within normal limits  APTT - Abnormal; Notable for the following components:   aPTT 38 (*)    All other components within normal limits  GLUCOSE, CAPILLARY - Abnormal; Notable for the following components:   Glucose-Capillary 209 (*)    All other components within normal limits  CBC  PROTIME-INR  TROPONIN I  URINALYSIS, COMPLETE (UACMP) WITH MICROSCOPIC   ____________________________________________  EKG  ED ECG REPORT I, Eula Listen, the attending physician, personally viewed and interpreted this ECG.   Date: 06/13/2017  EKG Time: 1629  Rate: 77  Rhythm: normal sinus rhythm  Axis: leftward  Intervals:none  ST&T Change: No STEMI  ____________________________________________  RADIOLOGY  Dg Chest 2 View  Result Date: 06/13/2017 CLINICAL DATA:  Syncope. EXAM: CHEST  2 VIEW COMPARISON:  06/13/2017 FINDINGS: Normal heart size. No pleural effusion. Asymmetric elevation of right hemidiaphragm. Reticular opacities within the right upper lobe and throughout the left lung are unchanged from previous exam. IMPRESSION: No change in bilateral reticular interstitial opacities compared with previous exam. Electronically Signed   By: Kerby Moors M.D.   On: 06/13/2017 17:20   Dg Chest 2 View  Result Date: 06/13/2017 CLINICAL DATA:  Short of breath. Right-sided pain. Fall 2 days ago. EXAM: CHEST  2 VIEW COMPARISON:  11/20/2010 FINDINGS: Lateral view degraded by patient arm position. Moderate right hemidiaphragm elevation. Midline trachea. Mild cardiomegaly. Tortuous thoracic aorta. Atherosclerosis in the transverse aorta. No pleural effusion or pneumothorax. Right upper lobe interstitial opacity is progressive since the prior chest radiograph. interstitial thickening at the left lung base which is favored to represent scarring and may be slightly increased. IMPRESSION: Since 2012,  significant interval progression of right upper lobe reticular opacity. Although this is not a typical appearance of acute pneumonia, this cannot be excluded. Followup PA and lateral chest X-ray is recommended in 3-4 weeks following trial of antibiotic therapy to ensure resolution and exclude underlying malignancy. Cardiomegaly without congestive failure. Aortic and branch vessel atherosclerosis. Electronically Signed   By: Abigail Miyamoto M.D.   On: 06/13/2017 12:54   Dg Tibia/fibula Left  Result Date: 06/13/2017 CLINICAL DATA:  Left leg pain after syncopal episode. EXAM: LEFT TIBIA AND FIBULA - 2 VIEW COMPARISON:  None. FINDINGS: There is no evidence of fracture, joint dislocations or other focal bone lesions. Soft tissues are unremarkable. Tibial arteriosclerosis is noted. IMPRESSION: Negative for acute fracture nor joint dislocations. Electronically Signed   By: Ashley Royalty M.D.   On: 06/13/2017 17:20   Ct Head Wo Contrast  Result Date: 06/13/2017 CLINICAL DATA:  Syncopal episode with altered level of consciousness. Patient received IV contrast for chest CT prior to the study. EXAM: CT HEAD WITHOUT CONTRAST TECHNIQUE: Contiguous axial images were obtained from the base of the skull through the vertex without intravenous contrast. COMPARISON:  None. FINDINGS: Brain: Atrophy with chronic moderate appearing small vessel ischemia of periventricular white matter. No intra-axial mass nor extra-axial fluid collections. No hydrocephalus. No effacement of the basal cisterns or fourth ventricle. Vascular: Atherosclerotic calcifications of the carotid siphons. Hyperdense appearance of the intracranial vessels would be in keeping with recent IV contrast administration for the patient's chest CT earlier in the day. Mild prominence of the right MCA at the takeoff of the posterior communicating artery likely from a normal posterior communicating infundibulum. No definite aneurysm. Skull: Negative for fracture.  Sinuses/Orbits: Mild mucosal thickening of the maxillary sinuses with possible mucous retention cysts of the right maxillary sinus. Bilateral cataract extractions. Other: None IMPRESSION: 1. Atrophy with chronic appearing moderate small vessel ischemic disease of periventricular white matter. 2. No acute intracranial abnormality. Electronically Signed   By: Ashley Royalty M.D.   On: 06/13/2017 17:30   Ct Angio Chest Pe W And/or Wo Contrast  Result Date: 06/13/2017 CLINICAL DATA:  Shortness of breath and right-sided chest pain after a fall 2 days ago. EXAM: CT ANGIOGRAPHY CHEST WITH CONTRAST TECHNIQUE: Multidetector CT imaging of the chest was performed using the standard protocol during bolus administration of intravenous contrast. Multiplanar CT image reconstructions and MIPs were obtained to evaluate the vascular anatomy. CONTRAST:  32mL ISOVUE-370 IOPAMIDOL (ISOVUE-370) INJECTION 76% COMPARISON:  None. FINDINGS: Cardiovascular: The heart is normal in size for age. No pericardial effusion. There is tortuosity and mild ectasia of the thoracic aorta but no aneurysm. The pulmonary arteries are enlarged suggesting pulmonary hypertension. There are well opacified and no filling defects are identified to suggest pulmonary embolism. Mediastinum/Nodes: Small mediastinal and right hilar lymph nodes likely inflammatory/ hyperplastic. No mass or overt adenopathy. The esophagus is grossly normal. Lungs/Pleura: Mosaic pattern of attenuation in the lungs could be due to acute bronchiolitis, constrictive bronchiolitis, reactive airways disease, cryptogenic organizing pneumonia or hypersensitivity pneumonitis. No focal airspace consolidation or pleural effusion. Upper Abdomen: Dilated colon is noted with colonic interposition. Pancreatic calcifications suggesting chronic calcific pancreatitis. Musculoskeletal: No chest wall mass, supraclavicular or axillary lymphadenopathy. No acute bony findings. No definite sternal, thoracic  vertebral body or rib fractures. Review of the MIP images confirms the above findings. IMPRESSION: 1. No CT findings for pulmonary embolism. 2. Enlarged pulmonary arteries suggesting pulmonary hypertension. 3. Mosaic pattern of lung attenuation.  Please see above discussion. 4. No focal infiltrates or effusions. 5. Intact bony thorax. Aortic Atherosclerosis (ICD10-I70.0). Electronically Signed   By: Marijo Sanes M.D.   On: 06/13/2017 14:27   Dg Knee Complete 4 Views Left  Result Date: 06/13/2017 CLINICAL DATA:  Syncopal episode with left knee pain. EXAM: LEFT KNEE - COMPLETE 4+ VIEW COMPARISON:  None. FINDINGS: No evidence of fracture, dislocation, or joint effusion. Slight medial femorotibial joint space narrowing likely representing mild cartilaginous thinning. Soft tissues are unremarkable. IMPRESSION: Negative for acute fracture or joint dislocation. Slight medial femorotibial joint space narrowing which may reflect chondral thinning. Electronically Signed   By: Ashley Royalty M.D.   On: 06/13/2017 17:21   Dg Hip Unilat W Or Wo Pelvis 2-3 Views Right  Result Date: 06/13/2017 CLINICAL DATA:  Syncopal episode. EXAM: DG HIP (WITH OR WITHOUT PELVIS) 2-3V RIGHT COMPARISON:  05/09/2016. FINDINGS: Degenerative changes lumbar spine and both hips. Diffuse osteopenia No acute bony abnormality identified. No evidence of fracture. Contrast in the bladder, presumably from prior CT. Bladder diverticuli noted. Prostate enlargement. Peripheral vascular calcification. IMPRESSION: No acute bony abnormality identified. No evidence fracture. Degenerative changes lumbar spine and both hips. 2. Contrast noted in the bladder, presumably from prior CT. Bladder diverticuli noted. Prostate is prominent. 3. Peripheral vascular disease. Electronically Signed   By: Marcello Moores  Register   On: 06/13/2017 17:23    ____________________________________________   PROCEDURES  Procedure(s) performed: None  Procedures  Critical Care  performed: No ____________________________________________   INITIAL IMPRESSION / ASSESSMENT AND PLAN / ED COURSE  Pertinent labs & imaging results that were available during my care of the patient were reviewed by me and considered in my medical decision making (see chart for details).  83 y.o. male, just discharged from the emergency department, presenting for a fall with unresponsiveness.  Overall, the patient will be evaluated both her trauma and syncope.  A CT of the head, chest x-ray, pelvis and right hip, and left tibia and knee x-rays are ordered.  The patient's EKG does not show any ischemic changes or arrhythmia although transient arrhythmia is not excluded.  Troponin has been ordered.  We will also evaluate the patient for UTI, electrolyte abnormality, or other possible etiology.  Plan reevaluation for final disposition.  I have reviewed the patient's medical chart, including his prior ED visit today.  ----------------------------------------- 7:22 PM on 06/13/2017 -----------------------------------------  The patient's workup in the emergency department has been reassuring.  He does have hyperglycemia without DKA, but this is unlikely to be the cause of his syncope.  His blood counts are reassuring, and his troponin is negative.  His imaging is also reassuring.  His CT head does not show any acute intracranial process, his hip and pelvis, left tibia and left knee do not show any evidence of fracture.  At this time, the patient continues to rest comfortably and is hemodynamically stable.  He does continue to have some mild right hip pain, so have ordered Tylenol for that.  The patient's family is not bedside and they report that the patient in fact had several episodes of syncope back to back.  They report brief episodes that were intermittent with normalization of mental status in between and no complaints of any acute pain or other abnormality.  At this time, we will plan to admit  the patient for continued monitoring for his recurrent syncope, the patient and his family are in agreement with the plan.  ____________________________________________  FINAL CLINICAL IMPRESSION(S) / ED DIAGNOSES  Final diagnoses:  Recurrent syncope  Abrasion of left lower extremity, initial encounter  Right hip pain  Hyperglycemia         NEW MEDICATIONS STARTED DURING THIS VISIT:  This SmartLink is deprecated. Use AVSMEDLIST instead to display the medication list for a patient.    Eula Listen, MD 06/13/17 1924

## 2017-06-13 NOTE — H&P (Signed)
[facility] NAME: Tyler Kirk    MR#:  [id number]  DATE OF BIRTH:  1919-11-18  DATE OF ADMISSION:  06/13/2017  PRIMARY CARE PHYSICIAN: Jerrol Banana., MD   REQUESTING/REFERRING PHYSICIAN: Eula Listen, MD  CHIEF COMPLAINT:   Chief Complaint  Patient presents with  . Loss of Consciousness   Loss of consciousness today. HISTORY OF PRESENT ILLNESS:  Tyler Kirk  is a 83 y.o. male with a known history of hypertension, hyperlipidemia, A. fib and diabetes.  The patient was discharged from the ED today after evaluation of a fall and short of breath.  He presented to the ED with syncope episodes after discharge.  The patient does not know if he has syncope on out but according to his daughter, the patient was found face down on the ground and unresponsive up to 15 minutes.  The patient complains of her right upper hip pain but denies any headache, dizziness, nausea vomiting or chest pain or palpitation.  He only complains of thirsty.  Workup including CT chest angiogram and x-rays so far are negative.  Dr. Mariea Clonts requested admission for syncope.  PAST MEDICAL HISTORY:   Past Medical History:  Diagnosis Date  . Afib ([place])   . Arthritis   . BPH (benign prostatic hypertrophy)   . Cataracts, bilateral   . Depression   . Diabetes mellitus without complication ([place])   . GERD (gastroesophageal reflux disease)   . Hernia, inguinal    Chronic Large Inguinal Hernia  . Hyperlipidemia   . Hypertension   . Prostate cancer ([place])   . Pulmonary embolism ([place])   . UTI (urinary tract infection)     PAST SURGICAL HISTORY:   Past Surgical History:  Procedure Laterality Date  . bladder diverticulum removed  1960  . EYE SURGERY     catarct  . PROSTATE SURGERY     prostate removed  . skin cancer: Removed  Left    Left arm near wrist area  . TOOTH EXTRACTION      SOCIAL HISTORY:   Social History   Tobacco Use   . Smoking status: Never Smoker  . Smokeless tobacco: Never Used  Substance Use Topics  . Alcohol use: No    FAMILY HISTORY:   Family History  Problem Relation Age of Onset  . Stroke Mother   . Hypertension Mother   . Stroke Father   . Prostate cancer Neg Hx   . Kidney cancer Neg Hx   . Bladder Cancer Neg Hx     DRUG ALLERGIES:   Allergies  Allergen Reactions  . Celexa [Citalopram Hydrobromide]     diarrhea    REVIEW OF SYSTEMS:   Review of Systems  Constitutional: Negative for chills, fever and malaise/fatigue.  HENT: Negative for sore throat.   Eyes: Negative for blurred vision and double vision.  Respiratory: Negative for cough, hemoptysis, shortness of breath, wheezing and stridor.   Cardiovascular: Negative for chest pain, palpitations, orthopnea and leg swelling.  Gastrointestinal: Negative for abdominal pain, blood in stool, diarrhea, melena, nausea and vomiting.  Genitourinary: Negative for dysuria, flank pain and hematuria.  Musculoskeletal: Negative for back pain and joint pain.  Neurological: Negative for dizziness, sensory change, focal weakness, seizures, loss of consciousness, weakness and headaches.  Endo/Heme/Allergies: Negative for polydipsia.  Psychiatric/Behavioral: Negative for depression. The patient is not nervous/anxious.     MEDICATIONS AT HOME:   Prior to Admission medications   Medication  Sig Start Date End Date Taking? Authorizing Provider  aspirin 81 MG tablet Take 1 tablet (81 mg total) by mouth daily. 04/16/17  Yes Plonk, Gwyndolyn Saxon, MD  docusate sodium (COLACE) 250 MG capsule Take 250 mg by mouth daily as needed.   Yes [provider]  fluticasone (FLONASE) 50 MCG/ACT nasal spray Place 2 sprays into the nose daily as needed. 06/07/14  Yes [provider]  furosemide (LASIX) 20 MG tablet Take 1 tablet (20 mg total) by mouth daily. 06/12/17  Yes Jerrol Banana., MD  glucose blood (ONE TOUCH ULTRA TEST) test strip 1  each by Other route every morning. Use as instructed Dx:E11.9 12/09/16  Yes Jerrol Banana., MD  HYDROcodone-acetaminophen (NORCO/VICODIN) 5-325 MG tablet Take 0.5 tablets by mouth every 6 (six) hours as needed for moderate pain. 06/12/17  Yes Jerrol Banana., MD  isosorbide mononitrate (IMDUR) 30 MG 24 hr tablet Take 1 tablet (30 mg total) by mouth daily. 06/12/17  Yes Jerrol Banana., MD  mometasone (ELOCON) 0.1 % cream Apply 1 application topically 2 (two) times daily as needed. 04/16/17  Yes Plonk, William, MD  Multiple Vitamin tablet Take 1 tablet by mouth daily. 08/01/10  Yes [provider]  tamsulosin (FLOMAX) 0.4 MG CAPS capsule Take 1 capsule by mouth daily.    Yes [provider]      VITAL SIGNS:  Blood pressure 121/77, pulse 79, temperature 98.6 F (37 C), temperature source Oral, resp. rate 19, SpO2 93 %.  PHYSICAL EXAMINATION:  Physical Exam  GENERAL:  83 y.o.-year-old patient lying in the bed with no acute distress.  EYES: Pupils equal, round, reactive to light and accommodation. No scleral icterus. Extraocular muscles intact.  HEENT: Head atraumatic, normocephalic. Oropharynx and nasopharynx clear.  Dry oral mucosa. NECK:  Supple, no jugular venous distention. No thyroid enlargement, no tenderness.  LUNGS: Normal breath sounds bilaterally, no wheezing, rales,rhonchi or crepitation. No use of accessory muscles of respiration.  CARDIOVASCULAR: S1, S2 normal. No murmurs, rubs, or gallops.  ABDOMEN: Soft, nontender, nondistended. Bowel sounds present. No organomegaly or mass.  EXTREMITIES: No pedal edema, cyanosis, or clubbing.  NEUROLOGIC: Cranial nerves II through XII are intact. Muscle strength 4/5 in all extremities. Sensation intact. Gait not checked.  PSYCHIATRIC: The patient is alert and oriented x 3.  SKIN: No obvious rash, lesion, or ulcer.   LABORATORY PANEL:   CBC Recent Labs  Lab 06/13/17 1710  WBC 10.4  HGB 14.2  HCT  43.4  PLT 252   ------------------------------------------------------------------------------------------------------------------  Chemistries  Recent Labs  Lab 06/13/17 1710  NA 139  K 4.3  CL 106  CO2 24  GLUCOSE 251*  BUN 31*  CREATININE 1.04  CALCIUM 9.1  AST 35  ALT 23  ALKPHOS 74  BILITOT 0.8   ------------------------------------------------------------------------------------------------------------------  Cardiac Enzymes Recent Labs  Lab 06/13/17 1710  TROPONINI <0.03   ------------------------------------------------------------------------------------------------------------------  RADIOLOGY:  Dg Chest 2 View  Result Date: 06/13/2017 CLINICAL DATA:  Syncope. EXAM: CHEST  2 VIEW COMPARISON:  06/13/2017 FINDINGS: Normal heart size. No pleural effusion. Asymmetric elevation of right hemidiaphragm. Reticular opacities within the right upper lobe and throughout the left lung are unchanged from previous exam. IMPRESSION: No change in bilateral reticular interstitial opacities compared with previous exam. Electronically Signed   By: Kerby Moors M.D.   On: 06/13/2017 17:20   Dg Chest 2 View  Result Date: 06/13/2017 CLINICAL DATA:  Short of breath. Right-sided pain. Fall 2 days  ago. EXAM: CHEST  2 VIEW COMPARISON:  11/20/2010 FINDINGS: Lateral view degraded by patient arm position. Moderate right hemidiaphragm elevation. Midline trachea. Mild cardiomegaly. Tortuous thoracic aorta. Atherosclerosis in the transverse aorta. No pleural effusion or pneumothorax. Right upper lobe interstitial opacity is progressive since the prior chest radiograph. interstitial thickening at the left lung base which is favored to represent scarring and may be slightly increased. IMPRESSION: Since 2012, significant interval progression of right upper lobe reticular opacity. Although this is not a typical appearance of acute pneumonia, this cannot be excluded. Followup PA and lateral chest  X-ray is recommended in 3-4 weeks following trial of antibiotic therapy to ensure resolution and exclude underlying malignancy. Cardiomegaly without congestive failure. Aortic and branch vessel atherosclerosis. Electronically Signed   By: Abigail Miyamoto M.D.   On: 06/13/2017 12:54   Dg Tibia/fibula Left  Result Date: 06/13/2017 CLINICAL DATA:  Left leg pain after syncopal episode. EXAM: LEFT TIBIA AND FIBULA - 2 VIEW COMPARISON:  None. FINDINGS: There is no evidence of fracture, joint dislocations or other focal bone lesions. Soft tissues are unremarkable. Tibial arteriosclerosis is noted. IMPRESSION: Negative for acute fracture nor joint dislocations. Electronically Signed   By: Ashley Royalty M.D.   On: 06/13/2017 17:20   Ct Head Wo Contrast  Result Date: 06/13/2017 CLINICAL DATA:  Syncopal episode with altered level of consciousness. Patient received IV contrast for chest CT prior to the study. EXAM: CT HEAD WITHOUT CONTRAST TECHNIQUE: Contiguous axial images were obtained from the base of the skull through the vertex without intravenous contrast. COMPARISON:  None. FINDINGS: Brain: Atrophy with chronic moderate appearing small vessel ischemia of periventricular white matter. No intra-axial mass nor extra-axial fluid collections. No hydrocephalus. No effacement of the basal cisterns or fourth ventricle. Vascular: Atherosclerotic calcifications of the carotid siphons. Hyperdense appearance of the intracranial vessels would be in keeping with recent IV contrast administration for the patient's chest CT earlier in the day. Mild prominence of the right MCA at the takeoff of the posterior communicating artery likely from a normal posterior communicating infundibulum. No definite aneurysm. Skull: Negative for fracture. Sinuses/Orbits: Mild mucosal thickening of the maxillary sinuses with possible mucous retention cysts of the right maxillary sinus. Bilateral cataract extractions. Other: None IMPRESSION: 1. Atrophy  with chronic appearing moderate small vessel ischemic disease of periventricular white matter. 2. No acute intracranial abnormality. Electronically Signed   By: Ashley Royalty M.D.   On: 06/13/2017 17:30   Ct Angio Chest Pe W And/or Wo Contrast  Result Date: 06/13/2017 CLINICAL DATA:  Shortness of breath and right-sided chest pain after a fall 2 days ago. EXAM: CT ANGIOGRAPHY CHEST WITH CONTRAST TECHNIQUE: Multidetector CT imaging of the chest was performed using the standard protocol during bolus administration of intravenous contrast. Multiplanar CT image reconstructions and MIPs were obtained to evaluate the vascular anatomy. CONTRAST:  68mL ISOVUE-370 IOPAMIDOL (ISOVUE-370) INJECTION 76% COMPARISON:  None. FINDINGS: Cardiovascular: The heart is normal in size for age. No pericardial effusion. There is tortuosity and mild ectasia of the thoracic aorta but no aneurysm. The pulmonary arteries are enlarged suggesting pulmonary hypertension. There are well opacified and no filling defects are identified to suggest pulmonary embolism. Mediastinum/Nodes: Small mediastinal and right hilar lymph nodes likely inflammatory/ hyperplastic. No mass or overt adenopathy. The esophagus is grossly normal. Lungs/Pleura: Mosaic pattern of attenuation in the lungs could be due to acute bronchiolitis, constrictive bronchiolitis, reactive airways disease, cryptogenic organizing pneumonia or hypersensitivity pneumonitis. No focal airspace consolidation or pleural effusion.  Upper Abdomen: Dilated colon is noted with colonic interposition. Pancreatic calcifications suggesting chronic calcific pancreatitis. Musculoskeletal: No chest wall mass, supraclavicular or axillary lymphadenopathy. No acute bony findings. No definite sternal, thoracic vertebral body or rib fractures. Review of the MIP images confirms the above findings. IMPRESSION: 1. No CT findings for pulmonary embolism. 2. Enlarged pulmonary arteries suggesting pulmonary  hypertension. 3. Mosaic pattern of lung attenuation.  Please see above discussion. 4. No focal infiltrates or effusions. 5. Intact bony thorax. Aortic Atherosclerosis (ICD10-I70.0). Electronically Signed   By: Marijo Sanes M.D.   On: 06/13/2017 14:27   Dg Knee Complete 4 Views Left  Result Date: 06/13/2017 CLINICAL DATA:  Syncopal episode with left knee pain. EXAM: LEFT KNEE - COMPLETE 4+ VIEW COMPARISON:  None. FINDINGS: No evidence of fracture, dislocation, or joint effusion. Slight medial femorotibial joint space narrowing likely representing mild cartilaginous thinning. Soft tissues are unremarkable. IMPRESSION: Negative for acute fracture or joint dislocation. Slight medial femorotibial joint space narrowing which may reflect chondral thinning. Electronically Signed   By: Ashley Royalty M.D.   On: 06/13/2017 17:21   Dg Hip Unilat W Or Wo Pelvis 2-3 Views Right  Result Date: 06/13/2017 CLINICAL DATA:  Syncopal episode. EXAM: DG HIP (WITH OR WITHOUT PELVIS) 2-3V RIGHT COMPARISON:  05/09/2016. FINDINGS: Degenerative changes lumbar spine and both hips. Diffuse osteopenia No acute bony abnormality identified. No evidence of fracture. Contrast in the bladder, presumably from prior CT. Bladder diverticuli noted. Prostate enlargement. Peripheral vascular calcification. IMPRESSION: No acute bony abnormality identified. No evidence fracture. Degenerative changes lumbar spine and both hips. 2. Contrast noted in the bladder, presumably from prior CT. Bladder diverticuli noted. Prostate is prominent. 3. Peripheral vascular disease. Electronically Signed   By: Marcello Moores  Register   On: 06/13/2017 17:23      IMPRESSION AND PLAN:   Recurrent syncope. Patient will be placed for observation.  Continue aspirin.  Continue aspirin. Continue telemetry monitor, orthostatic vital sign, echocardiograph.  Dehydration.  Start normal saline IV in the follow-up BMP.  Check urinalysis.  Hold Lasix.  Hypertension.  Hold  Lasix and immature due to low side blood pressure and dehydration.  Generalized weakness.  PT evaluation.  Diabetes.  Start sliding scale.  All the records are reviewed and case discussed with ED provider. Management plans discussed with the patient, 2 daughters and 2 son-in-law,and they are in agreement.  CODE STATUS: DNR  TOTAL TIME TAKING CARE OF THIS PATIENT: 52 minutes.    Demetrios Loll M.D on 06/13/2017 at 8:40 PM  Between 7am to 6pm - Pager - [phone]  After 6pm go to [email or web address] - password [email or web address] [facility]  [employer] [facility]  Office  [phone]  CC: Primary care physician; Jerrol Banana., MD   Note: This dictation was prepared with Dragon dictation along with smaller phrase technology. Any transcriptional errors that result from this process are unin

## 2017-06-14 ENCOUNTER — Observation Stay (HOSPITAL_BASED_OUTPATIENT_CLINIC_OR_DEPARTMENT_OTHER)
Admit: 2017-06-14 | Discharge: 2017-06-14 | Disposition: A | Discharge status: Home / Self Care | Payer: Medicare Other | Attending: Internal Medicine | Admitting: Internal Medicine

## 2017-06-14 DIAGNOSIS — I361 Nonrheumatic tricuspid (valve) insufficiency: Secondary | ICD-10-CM

## 2017-06-14 LAB — CBC
HEMATOCRIT: 40.8 % (ref 40.0–52.0)
HEMOGLOBIN: 13.4 g/dL (ref 13.0–18.0)
MCH: 28.8 pg (ref 26.0–34.0)
MCHC: 33 g/dL (ref 32.0–36.0)
MCV: 87.2 fL (ref 80.0–100.0)
Platelets: 225 10*3/uL (ref 150–440)
RBC: 4.68 MIL/uL (ref 4.40–5.90)
RDW: 13.7 % (ref 11.5–14.5)
WBC: 7.8 10*3/uL (ref 3.8–10.6)

## 2017-06-14 LAB — BASIC METABOLIC PANEL
ANION GAP: 9 (ref 5–15)
BUN: 33 mg/dL — ABNORMAL HIGH (ref 6–20)
CHLORIDE: 110 mmol/L (ref 101–111)
CO2: 21 mmol/L — AB (ref 22–32)
Calcium: 8.5 mg/dL — ABNORMAL LOW (ref 8.9–10.3)
Creatinine, Ser: 0.84 mg/dL (ref 0.61–1.24)
GFR calc non Af Amer: >60 mL/min (ref >60)
Glucose, Bld: 177 mg/dL — ABNORMAL HIGH (ref 65–99)
Potassium: 3.8 mmol/L (ref 3.5–5.1)
Sodium: 140 mmol/L (ref 135–145)

## 2017-06-14 LAB — GLUCOSE, CAPILLARY
GLUCOSE-CAPILLARY: 156 mg/dL — AB (ref 65–99)
GLUCOSE-CAPILLARY: 158 mg/dL — AB (ref 65–99)
GLUCOSE-CAPILLARY: 176 mg/dL — AB (ref 65–99)
Glucose-Capillary: 244 mg/dL — ABNORMAL HIGH (ref 65–99)

## 2017-06-14 MED ORDER — SODIUM CHLORIDE 0.9 % IV BOLUS (SEPSIS)
1000.0000 mL | Freq: Once | INTRAVENOUS | Status: AC
  Administered 2017-06-14: 1000 mL via INTRAVENOUS

## 2017-06-14 MED ORDER — GUAIFENESIN ER 600 MG PO TB12
600.0000 mg | ORAL_TABLET | Freq: Two times a day (BID) | ORAL | Status: DC
  Administered 2017-06-14 – 2017-06-15 (×3): 600 mg via ORAL
  Filled 2017-06-14 (×3): qty 1

## 2017-06-14 NOTE — Clinical Social Work Note (Signed)
Clinical Social Work Assessment  Patient Details  Name: Tyler Kirk MRN: [id number] Date of Birth: 04/17/1920  Date of referral:  06/14/17               Reason for consult:  Facility Placement                Permission sought to share information with:  Facility Art therapist granted to share information::  Yes, Verbal Permission Granted  Name::        Agency::     Relationship::     Contact Information:     Housing/Transportation Living arrangements for the past 2 months:  Single Family Home Source of Information:  Patient, Medical Team, Adult Children Patient Interpreter Needed:  None Criminal Activity/Legal Involvement Pertinent to Current Situation/Hospitalization:  No - Comment as needed Significant Relationships:  Adult Children, Community Support Lives with:  Self Do you feel safe going back to the place where you live?  Yes Need for family participation in patient care:  No (Coment)  Care giving concerns:  PT recommendation for STR   Social Worker assessment / plan:  CSW met with the patient and his daughter Tyler Kirk at bedside to discuss discharge planning. The patient stated that he agrees with PT's recommendation and gave permission to discuss the situation with his daughter Tyler Kirk by phone. The CSW contacted Tyler Kirk who agreed with the plan and understands that her father will most likely discharge tomorrow pending medical clearance and that they may have limited offers.   CSW has begun the referral process and will follow up tomorrow for imminent discharge with bed offers and facilitation of discharge.  Employment status:  Retired Nurse, adult PT Recommendations:  [place] / Referral to community resources:  [place]  Patient/Family's Response to care: The patient and his family thanked the CSW for assistance.  Patient/Family's Understanding of and Emotional Response to  Diagnosis, Current Treatment, and Prognosis: The patient and his family understand the recommendation and are in agreement with STR.   Emotional Assessment Appearance:  Appears younger than stated age Attitude/Demeanor/Rapport:  (Pleasant, bright, cooperative) Affect (typically observed):  Appropriate, Pleasant Orientation:  Oriented to Self, Oriented to Place, Oriented to Situation Alcohol / Substance use:  Never Used Psych involvement (Current and /or in the community):  No (Comment)  Discharge Needs  Concerns to be addressed:  Care Coordination, Discharge Planning Concerns Readmission within the last 30 days:  No Current discharge risk:  Lives alone Barriers to Discharge:  Continued Medical Work up   [employer], LCSW 06/14/2017, 5:03 PM

## 2017-06-14 NOTE — Progress Notes (Signed)
Spoke with Dr. Marcille Blanco telemetry called this nurse, pt with a 9 beat run on SVT and now pt has sinus Tachycardia in the 130's. MD to place orders.

## 2017-06-14 NOTE — Progress Notes (Signed)
Pt alert and oriented this shift. Pt stating that he is to weak to stand up this shift. Pt using urinal to void. No complaints of pain this shift. Pt able to sleep in between care.

## 2017-06-14 NOTE — Evaluation (Signed)
Physical Therapy Evaluation Patient Details Name: Tyler Kirk MRN: [id number] DOB: Jul 01, 1920 Today's Date: 06/14/2017   History of Present Illness  83 yo male with onset of syncopal episode and cleared for PE was tachycardic, second admission to hosp in the last 24 hours.  PMHx:  a-fib, OA, cataracts, GERD, DM, UTI, PE, prostate CA, HTN  Clinical Impression  Pt is up to walk with RW for short trip, noted his gait was unsteady and needed close S to control the movement.  Has plan to go to SNF at his choice to increase strength and safety, and will expect him to go home afterward after increasing his independent mobility and safety to walk on RW.    Follow Up Recommendations SNF    Equipment Recommendations  Rolling walker with 5" wheels    Recommendations for Other Services Rehab consult     Precautions / Restrictions Precautions Precautions: Fall Restrictions Weight Bearing Restrictions: No Other Position/Activity Restrictions: has trouble getting up in the bed, very slumped      Mobility  Bed Mobility Overal bed mobility: Needs Assistance Bed Mobility: Supine to Sit     Supine to sit: Mod assist     General bed mobility comments: assisted to reach the rail to roll then mod assist to sit up with trunk support.  Transfers Overall transfer level: Needs assistance Equipment used: Rolling walker (2 wheeled);1 person hand held assist Transfers: Sit to/from [facility] Sit to Stand: Min assist;From elevated surface Stand pivot transfers: Min assist;From elevated surface       General transfer comment: cued hand placement and then could get to chair  Ambulation/Gait Ambulation/Gait assistance: Min assist Ambulation Distance (Feet): 5 Feet Assistive device: Rolling walker (2 wheeled) Gait Pattern/deviations: Step-through pattern;Step-to pattern;Decreased stride length;Wide base of support;Trunk flexed;Shuffle Gait velocity: reduced Gait velocity  interpretation: Below normal speed for age/gender General Gait Details: slow pace with dense cues for turning walker and backing up to chair  Stairs            Wheelchair Mobility    Modified Rankin (Stroke Patients Only)       Balance Overall balance assessment: History of Falls;Needs assistance Sitting-balance support: Feet supported;Bilateral upper extremity supported Sitting balance-Leahy Scale: Fair     Standing balance support: Bilateral upper extremity supported;During functional activity Standing balance-Leahy Scale: Poor                               Pertinent Vitals/Pain Pain Assessment: Faces Faces Pain Scale: Hurts little more Pain Location: LE's with movement Pain Descriptors / Indicators: Sore Pain Intervention(s): Limited activity within patient's tolerance;Monitored during session;Repositioned    Home Living Family/patient expects to be discharged to:: Private residence Living Arrangements: Alone Available Help at Discharge: Family;Available PRN/intermittently(children rotate bringing dinner) Type of Home: House Home Access: Stairs to enter Entrance Stairs-Rails: (has one rail on 6 and none on the 3) Entrance Stairs-Number of Steps: 3 or 6 Home Layout: Other (Comment)(split level with one step no railing) Home Equipment: Walker - 2 wheels Additional Comments: was on walker prior to the [place]    Prior Function Level of Independence: Needs assistance   Gait / Transfers Assistance Needed: RW with mod I in his house  ADL's / Homemaking Assistance Needed: children assist with meals, has been I to bathe and dress        Hand Dominance   Dominant Hand: Right    Extremity/Trunk Assessment  Upper Extremity Assessment Upper Extremity Assessment: Overall WFL for tasks assessed    Lower Extremity Assessment Lower Extremity Assessment: Generalized weakness    Cervical / Trunk Assessment Cervical / Trunk Assessment: Normal   Communication   Communication: HOH  Cognition Arousal/Alertness: Awake/alert Behavior During Therapy: WFL for tasks assessed/performed Overall Cognitive Status: No family/caregiver present to determine baseline cognitive functioning                                 General Comments: has some difficulty with safety awareness, thinks he can handle with no assist to walk      General Comments      Exercises     Assessment/Plan    PT Assessment Patient needs continued PT services  PT Problem List Decreased range of motion;Decreased activity tolerance;Decreased balance;Decreased mobility;Decreased coordination;Decreased knowledge of use of DME;Decreased safety awareness;Cardiopulmonary status limiting activity;Decreased skin integrity       PT Treatment Interventions DME instruction;Gait training;Stair training;Functional mobility training;Therapeutic activities;Therapeutic exercise;Balance training;Neuromuscular re-education;Patient/family education    PT Goals (Current goals can be found in the Care Plan section)  Acute Rehab PT Goals Patient Stated Goal: To walk on RW and feel stronger PT Goal Formulation: With patient Time For Goal Achievement: 06/28/17 Potential to Achieve Goals: Good    Frequency Min 2X/week   Barriers to discharge Inaccessible home environment;Decreased caregiver support home alone in staired environment    Co-evaluation               AM-PAC PT "6 Clicks" Daily Activity  Outcome Measure Difficulty turning over in bed (including adjusting bedclothes, sheets and blankets)?: Unable Difficulty moving from lying on back to sitting on the side of the bed? : Unable Difficulty sitting down on and standing up from a chair with arms (e.g., wheelchair, bedside commode, etc,.)?: Unable Help needed moving to and from a bed to chair (including a wheelchair)?: A Lot Help needed walking in hospital room?: A Lot Help needed climbing 3-5 steps with  a railing? : Total 6 Click Score: 8    End of Session Equipment Utilized During Treatment: Gait belt Activity Tolerance: Patient tolerated treatment well;Patient limited by fatigue Patient left: in chair;with chair alarm set;with call bell/phone within reach Nurse Communication: Mobility status PT Visit Diagnosis: Unsteadiness on feet (R26.81);History of falling (Z91.81);Difficulty in walking, not elsewhere classified (R26.2);Adult, failure to thrive (R62.7)    Time: 0950-1025 PT Time Calculation (min) (ACUTE ONLY): 35 min   Charges:   PT Evaluation $PT Eval Moderate Complexity: 1 Mod PT Treatments $Gait Training: 8-22 mins   PT G Codes:   PT G-Codes **NOT FOR INPATIENT CLASS** Functional Assessment Tool Used: AM-PAC 6 Clicks Basic Mobility;Clinical judgement Functional Limitation: Mobility: Walking and moving around Mobility: Walking and Moving Around Current Status (U6333): At least 60 percent but less than 80 percent impaired, limited or restricted Mobility: Walking and Moving Around Goal Status [phone]): At least 1 percent but less than 20 percent impaired, limited or restricted    Ramond Dial 06/14/2017, 3:28 PM   Mee Hives, PT MS Acute Rehab Dept. Number: [place] and [place]

## 2017-06-14 NOTE — Progress Notes (Signed)
*  PRELIMINARY RESULTS* Echocardiogram 2D Echocardiogram has been performed.  Lavell Luster Emmelyn Schmale 06/14/2017, 9:41 AM

## 2017-06-14 NOTE — NC FL2 (Signed)
[place] LEVEL OF CARE SCREENING TOOL     IDENTIFICATION  Patient Name: Tyler Kirk Birthdate: 12/07/1919 Sex: male Admission Date (Current Location): 06/13/2017  [place] and [place] Number:  Engineering geologist and Address:  [facility], [address], [place], [place] [address]      Provider Number: [id number]  Attending Physician Name and Address:  Bettey Costa, MD  Relative Name and Phone Number:       Current Level of Care: Hospital Recommended Level of Care: [place] Prior Approval Number:    Date Approved/Denied: 06/14/17 PASRR Number: [id number]  Discharge Plan: SNF    Current Diagnoses: Patient Active Problem List   Diagnosis Date Noted  . Syncope 06/13/2017  . Elevated PSA 04/16/2017  . Urinary incontinence 02/14/2017  . Venous stasis of both lower extremities 02/14/2017  . Allergic rhinitis 02/14/2017  . Eczema 02/14/2017  . Gait instability 02/13/2017  . Microscopic hematuria 11/19/2016  . Arthritis 12/01/2014  . A-fib ([place]) 12/01/2014  . Benign fibroma of prostate 12/01/2014  . Cataract 12/01/2014  . Clinical depression 12/01/2014  . Esophagitis, reflux 12/01/2014  . Fatigue 12/01/2014  . Bilateral hearing loss 12/01/2014  . Hyperplasia, prostate 12/01/2014  . Mild cognitive impairment 12/01/2014  . Hx pulmonary embolism 12/01/2014  . Basal cell papilloma 12/01/2014  . Hernia, inguinal, unilateral 05/09/2006  . HLD (hyperlipidemia) 03/28/2006  . Diabetes mellitus, type 2 ([place]) 03/28/2006    Orientation RESPIRATION BLADDER Height & Weight     Self, Time, Situation, Place  Normal Continent Weight: 163 lb 12.8 oz (74.3 kg) Height:  5\' 8"  (172.7 cm)  BEHAVIORAL SYMPTOMS/MOOD NEUROLOGICAL BOWEL NUTRITION STATUS      Continent Diet(Heart healthy/Carb modified)  AMBULATORY STATUS COMMUNICATION OF NEEDS Skin   Extensive Assist Verbally Normal                       Personal  Care Assistance Level of Assistance  Bathing, Feeding, Dressing Bathing Assistance: Limited assistance Feeding assistance: Independent Dressing Assistance: Limited assistance     Functional Limitations Info             SPECIAL CARE FACTORS FREQUENCY  PT (By licensed PT)     PT Frequency: Up to 5X per day, 5 days per week              Contractures Contractures Info: Not present    Additional Factors Info  Code Status, Allergies, Insulin Sliding Scale Code Status Info: DNR Allergies Info:  Celexa Citalopram Hydrobromide   Insulin Sliding Scale Info: Novolog (0-9 units, tid with meals; 0-5 units qhs)       Current Medications (06/14/2017):  This is the current hospital active medication list Current Facility-Administered Medications  Medication Dose Route Frequency Provider Last Rate Last Dose  . 0.9 %  sodium chloride infusion   Intravenous Continuous Demetrios Loll, MD 50 mL/hr at 06/13/17 2211    . acetaminophen (TYLENOL) tablet 650 mg  650 mg Oral Q6H PRN Demetrios Loll, MD       Or  . acetaminophen (TYLENOL) suppository 650 mg  650 mg Rectal Q6H PRN Demetrios Loll, MD      . albuterol (PROVENTIL) (2.5 MG/3ML) 0.083% nebulizer solution 2.5 mg  2.5 mg Nebulization Q2H PRN Demetrios Loll, MD      . aspirin EC tablet 81 mg  81 mg Oral Daily Demetrios Loll, MD   81 mg at 06/14/17 4235  . bisacodyl (DULCOLAX)  EC tablet 5 mg  5 mg Oral Daily PRN Demetrios Loll, MD      . docusate sodium (COLACE) capsule 200 mg  200 mg Oral Daily PRN Demetrios Loll, MD      . enoxaparin (LOVENOX) injection 40 mg  40 mg Subcutaneous Q24H Demetrios Loll, MD      . guaiFENesin [facility]) 12 hr tablet 600 mg  600 mg Oral BID Bettey Costa, MD   600 mg at 06/14/17 1308  . insulin aspart (novoLOG) injection 0-5 Units  0-5 Units Subcutaneous QHS Demetrios Loll, MD      . insulin aspart (novoLOG) injection 0-9 Units  0-9 Units Subcutaneous TID WC Demetrios Loll, MD   3 Units at 06/14/17 1702  . ondansetron (ZOFRAN) tablet 4 mg  4 mg Oral  Q6H PRN Demetrios Loll, MD       Or  . ondansetron [facility]) injection 4 mg  4 mg Intravenous Q6H PRN Demetrios Loll, MD      . senna-docusate (Senokot-S) tablet 1 tablet  1 tablet Oral QHS PRN Demetrios Loll, MD      . tamsulosin [facility]) capsule 0.4 mg  0.4 mg Oral Daily Demetrios Loll, MD   0.4 mg at 06/14/17 8616     Discharge Medications: Please see discharge summary for a list of discharge medications.  Relevant Imaging Results:  Relevant Lab Results:   Additional Information SS# [id number]  Zettie Pho, LCSW

## 2017-06-14 NOTE — Progress Notes (Signed)
[place] at [place] NAME: Tyler Kirk    MR#:  [id number]  DATE OF BIRTH:  February 09, 1920  SUBJECTIVE:   c/o pain when he coughs right side lower back  REVIEW OF SYSTEMS:    Review of Systems  Constitutional: Negative for fever, chills weight loss HENT: Negative for ear pain, nosebleeds, congestion, facial swelling, rhinorrhea, neck pain, neck stiffness and ear discharge.   Respiratory: Negative for cough, shortness of breath, wheezing  Cardiovascular: Negative for chest pain, palpitations and leg swelling.  Gastrointestinal: Negative for heartburn, abdominal pain, vomiting, diarrhea or consitpation Genitourinary: Negative for dysuria, urgency, frequency, hematuria Musculoskeletal: Positive for back pain   Neurological: Negative for dizziness, seizures, syncope, focal weakness,  numbness and headaches.  Hematological: Does not bruise/bleed easily.  Psychiatric/Behavioral: Negative for hallucinations, confusion, dysphoric mood    Tolerating Diet: yes      DRUG ALLERGIES:   Allergies  Allergen Reactions  . Celexa [Citalopram Hydrobromide]     diarrhea    VITALS:  Blood pressure 132/75, pulse 83, temperature 99.1 F (37.3 C), temperature source Oral, resp. rate 18, height 5\' 8"  (1.727 m), weight 74.3 kg (163 lb 12.8 oz), SpO2 94 %.  PHYSICAL EXAMINATION:  Constitutional: Appears well-developed and well-nourished. No distress. HENT: Normocephalic. Marland Kitchen Oropharynx is clear and moist.  Eyes: Conjunctivae and EOM are normal. PERRLA, no scleral icterus.  Neck: Normal ROM. Neck supple. No JVD. No tracheal deviation. CVS: RRR, S1/S2 +, no murmurs, no gallops, no carotid bruit.  Pulmonary: Effort and breath sounds normal, no stridor, rhonchi, wheezes, rales.  Abdominal: Soft. BS +,  no distension, tenderness, rebound or guarding.  Musculoskeletal: Normal range of motion. No edema and no tenderness.  Neuro: Alert. CN 2-12 grossly  intact. No focal deficits. Skin: Minor abrasions noted on knees and elbow  Psychiatric: Normal mood and affect.  No vertebral tenderness    LABORATORY PANEL:   CBC Recent Labs  Lab 06/14/17 0510  WBC 7.8  HGB 13.4  HCT 40.8  PLT 225   ------------------------------------------------------------------------------------------------------------------  Chemistries  Recent Labs  Lab 06/13/17 1710 06/14/17 0510  NA 139 140  K 4.3 3.8  CL 106 110  CO2 24 21*  GLUCOSE 251* 177*  BUN 31* 33*  CREATININE 1.04 0.84  CALCIUM 9.1 8.5*  AST 35  --   ALT 23  --   ALKPHOS 74  --   BILITOT 0.8  --    ------------------------------------------------------------------------------------------------------------------  Cardiac Enzymes Recent Labs  Lab 06/12/17 1657 06/13/17 1215 06/13/17 1710  TROPONINI 0.01 <0.03 <0.03   ------------------------------------------------------------------------------------------------------------------  RADIOLOGY:  Dg Chest 2 View  Result Date: 06/13/2017 CLINICAL DATA:  Syncope. EXAM: CHEST  2 VIEW COMPARISON:  06/13/2017 FINDINGS: Normal heart size. No pleural effusion. Asymmetric elevation of right hemidiaphragm. Reticular opacities within the right upper lobe and throughout the left lung are unchanged from previous exam. IMPRESSION: No change in bilateral reticular interstitial opacities compared with previous exam. Electronically Signed   By: Kerby Moors M.D.   On: 06/13/2017 17:20   Dg Chest 2 View  Result Date: 06/13/2017 CLINICAL DATA:  Short of breath. Right-sided pain. Fall 2 days ago. EXAM: CHEST  2 VIEW COMPARISON:  11/20/2010 FINDINGS: Lateral view degraded by patient arm position. Moderate right hemidiaphragm elevation. Midline trachea. Mild cardiomegaly. Tortuous thoracic aorta. Atherosclerosis in the transverse aorta. No pleural effusion or pneumothorax. Right upper lobe interstitial opacity is progressive since the prior chest  radiograph. interstitial thickening at the  left lung base which is favored to represent scarring and may be slightly increased. IMPRESSION: Since 2012, significant interval progression of right upper lobe reticular opacity. Although this is not a typical appearance of acute pneumonia, this cannot be excluded. Followup PA and lateral chest X-ray is recommended in 3-4 weeks following trial of antibiotic therapy to ensure resolution and exclude underlying malignancy. Cardiomegaly without congestive failure. Aortic and branch vessel atherosclerosis. Electronically Signed   By: Abigail Miyamoto M.D.   On: 06/13/2017 12:54   Dg Tibia/fibula Left  Result Date: 06/13/2017 CLINICAL DATA:  Left leg pain after syncopal episode. EXAM: LEFT TIBIA AND FIBULA - 2 VIEW COMPARISON:  None. FINDINGS: There is no evidence of fracture, joint dislocations or other focal bone lesions. Soft tissues are unremarkable. Tibial arteriosclerosis is noted. IMPRESSION: Negative for acute fracture nor joint dislocations. Electronically Signed   By: Ashley Royalty M.D.   On: 06/13/2017 17:20   Ct Head Wo Contrast  Result Date: 06/13/2017 CLINICAL DATA:  Syncopal episode with altered level of consciousness. Patient received IV contrast for chest CT prior to the study. EXAM: CT HEAD WITHOUT CONTRAST TECHNIQUE: Contiguous axial images were obtained from the base of the skull through the vertex without intravenous contrast. COMPARISON:  None. FINDINGS: Brain: Atrophy with chronic moderate appearing small vessel ischemia of periventricular white matter. No intra-axial mass nor extra-axial fluid collections. No hydrocephalus. No effacement of the basal cisterns or fourth ventricle. Vascular: Atherosclerotic calcifications of the carotid siphons. Hyperdense appearance of the intracranial vessels would be in keeping with recent IV contrast administration for the patient's chest CT earlier in the day. Mild prominence of the right MCA at the takeoff of the  posterior communicating artery likely from a normal posterior communicating infundibulum. No definite aneurysm. Skull: Negative for fracture. Sinuses/Orbits: Mild mucosal thickening of the maxillary sinuses with possible mucous retention cysts of the right maxillary sinus. Bilateral cataract extractions. Other: None IMPRESSION: 1. Atrophy with chronic appearing moderate small vessel ischemic disease of periventricular white matter. 2. No acute intracranial abnormality. Electronically Signed   By: Ashley Royalty M.D.   On: 06/13/2017 17:30   Ct Angio Chest Pe W And/or Wo Contrast  Result Date: 06/13/2017 CLINICAL DATA:  Shortness of breath and right-sided chest pain after a fall 2 days ago. EXAM: CT ANGIOGRAPHY CHEST WITH CONTRAST TECHNIQUE: Multidetector CT imaging of the chest was performed using the standard protocol during bolus administration of intravenous contrast. Multiplanar CT image reconstructions and MIPs were obtained to evaluate the vascular anatomy. CONTRAST:  16mL ISOVUE-370 IOPAMIDOL (ISOVUE-370) INJECTION 76% COMPARISON:  None. FINDINGS: Cardiovascular: The heart is normal in size for age. No pericardial effusion. There is tortuosity and mild ectasia of the thoracic aorta but no aneurysm. The pulmonary arteries are enlarged suggesting pulmonary hypertension. There are well opacified and no filling defects are identified to suggest pulmonary embolism. Mediastinum/Nodes: Small mediastinal and right hilar lymph nodes likely inflammatory/ hyperplastic. No mass or overt adenopathy. The esophagus is grossly normal. Lungs/Pleura: Mosaic pattern of attenuation in the lungs could be due to acute bronchiolitis, constrictive bronchiolitis, reactive airways disease, cryptogenic organizing pneumonia or hypersensitivity pneumonitis. No focal airspace consolidation or pleural effusion. Upper Abdomen: Dilated colon is noted with colonic interposition. Pancreatic calcifications suggesting chronic calcific  pancreatitis. Musculoskeletal: No chest wall mass, supraclavicular or axillary lymphadenopathy. No acute bony findings. No definite sternal, thoracic vertebral body or rib fractures. Review of the MIP images confirms the above findings. IMPRESSION: 1. No CT findings for pulmonary embolism.  2. Enlarged pulmonary arteries suggesting pulmonary hypertension. 3. Mosaic pattern of lung attenuation.  Please see above discussion. 4. No focal infiltrates or effusions. 5. Intact bony thorax. Aortic Atherosclerosis (ICD10-I70.0). Electronically Signed   By: Marijo Sanes M.D.   On: 06/13/2017 14:27   Dg Knee Complete 4 Views Left  Result Date: 06/13/2017 CLINICAL DATA:  Syncopal episode with left knee pain. EXAM: LEFT KNEE - COMPLETE 4+ VIEW COMPARISON:  None. FINDINGS: No evidence of fracture, dislocation, or joint effusion. Slight medial femorotibial joint space narrowing likely representing mild cartilaginous thinning. Soft tissues are unremarkable. IMPRESSION: Negative for acute fracture or joint dislocation. Slight medial femorotibial joint space narrowing which may reflect chondral thinning. Electronically Signed   By: Ashley Royalty M.D.   On: 06/13/2017 17:21   Dg Hip Unilat W Or Wo Pelvis 2-3 Views Right  Result Date: 06/13/2017 CLINICAL DATA:  Syncopal episode. EXAM: DG HIP (WITH OR WITHOUT PELVIS) 2-3V RIGHT COMPARISON:  05/09/2016. FINDINGS: Degenerative changes lumbar spine and both hips. Diffuse osteopenia No acute bony abnormality identified. No evidence of fracture. Contrast in the bladder, presumably from prior CT. Bladder diverticuli noted. Prostate enlargement. Peripheral vascular calcification. IMPRESSION: No acute bony abnormality identified. No evidence fracture. Degenerative changes lumbar spine and both hips. 2. Contrast noted in the bladder, presumably from prior CT. Bladder diverticuli noted. Prostate is prominent. 3. Peripheral vascular disease. Electronically Signed   By: Marcello Moores  Register    On: 06/13/2017 17:23     ASSESSMENT AND PLAN:   83 year old male with a history of  PAF and diabetes who presented to the ED after syncopal fall.  1. Syncope: This is not appear to be cardiac in nature. This is likely vasovagal in nature. No acute abnormalities noted on telemetry Follow up on echocardiogram and cardiology consultation Follow-up on orthostatic vital signs Physical therapy consultation pending  2. Back pain after mechanical fall: Patient has no vertebral tenderness Continue when necessary pain medications   3. Dehydration/acute kidney injury: This is improved with IV fluids. Stop Lasix. 4. Diabetes: Continue sliding scale  5. BPH: Continue tamsulosin  Management plans discussed with the patient and daughter and they are in agreement.  CODE STATUS: DNR  TOTAL TIME TAKING CARE OF THIS PATIENT: 30 minutes.     POSSIBLE D/C tomorrow, DEPENDING ON CLINICAL CONDITION.   Mylinda Brook M.D on 06/14/2017 at 11:57 AM  Between 7am to 6pm - Pager - [phone] After 6pm go to [email or web address] - password [email or web address]  Office  [phone]  CC: Primary care physician; Jerrol Banana., MD  Note: This dictation was prepared with Dragon dictation along with smaller phrase technology. Any transcriptional errors that result from this process are unintentional.

## 2017-06-14 NOTE — Care Management Obs Status (Signed)
[facility] NOTIFICATION   Patient Details  Name: Tyler Kirk MRN: [id number] Date of Birth: [birth year]   Medicare Observation Status Notification Given:  Yes Patient could not sign electronically because could not use the mouse. Notice signed, one given to patient and the other to HIM for scanning   Katrina Stack, RN 06/14/2017, 3:05 PM

## 2017-06-14 NOTE — Care Management (Addendum)
Placed in observation for episodes of syncope. This was patient's second presentation to ED for symptoms within 24 hours.  Was found down outside by family members.  PT consult is pending. Spoke with primary nurse regarding obtaining orthostatic blood pressures- at least lying and sitting if patient is not able to stand.   he has had some tachycardia with rate up to 130 on telemetry. His medicare [facility] group is in network with [facility]. He lives alone and independent in all his adls and still drives.  This is confirmed by his daughter who is at bedside

## 2017-06-14 NOTE — Clinical Social Work Note (Signed)
CSW aware via chart review that PT is recommending STR. CSW will assess when able.  Santiago Bumpers, MSW, Latanya Presser [phone]

## 2017-06-15 LAB — ECHOCARDIOGRAM COMPLETE
Height: 68 in
Weight: 2620.8 oz

## 2017-06-15 LAB — GLUCOSE, CAPILLARY
GLUCOSE-CAPILLARY: 232 mg/dL — AB (ref 65–99)
Glucose-Capillary: 165 mg/dL — ABNORMAL HIGH (ref 65–99)

## 2017-06-15 MED ORDER — METOPROLOL TARTRATE 25 MG PO TABS: 12.5000 mg | ORAL_TABLET | Freq: Two times a day (BID) | ORAL | 0 refills | Status: DC

## 2017-06-15 MED ORDER — HYDROCODONE-ACETAMINOPHEN 5-325 MG PO TABS: 0.5000 | ORAL_TABLET | Freq: Four times a day (QID) | ORAL | 0 refills | Status: DC | PRN

## 2017-06-15 MED ORDER — ACETAMINOPHEN 325 MG PO TABS: 650.0000 mg | ORAL_TABLET | Freq: Four times a day (QID) | ORAL | Status: AC | PRN

## 2017-06-15 NOTE — Progress Notes (Signed)
Patient is being discharged to [facility] (unknown room number). Report given to [facility]. NT prepared patient for EMS transport and packed up belongings. Family bedside. Nurse removed IV and gave patient discharge packet.

## 2017-06-15 NOTE — Clinical Social Work Placement (Signed)
   CLINICAL SOCIAL WORK PLACEMENT  NOTE  Date:  06/15/2017  Patient Details  Name: Tyler Kirk MRN: [id number] Date of Birth: 10/19/1919  Clinical Social Work is seeking post-discharge placement for this patient at the [place] level of care (*CSW will initial, date and re-position this form in  chart as items are completed):  Yes   Patient/family provided with [facility] Work Department's list of facilities offering this level of care within the geographic area requested by the patient (or if unable, by the patient's family).  Yes   Patient/family informed of their freedom to choose among providers that offer the needed level of care, that participate in Medicare, Medicaid or managed care program needed by the patient, have an available bed and are willing to accept the patient.      Patient/family informed of [facility]'s ownership interest in [facility] and [facility], as well as of the fact that they are under no obligation to receive care at these facilities.  PASRR submitted to EDS on       PASRR number received on       Existing PASRR number confirmed on 06/14/17     FL2 transmitted to all facilities in geographic area requested by pt/family on 06/14/17     FL2 transmitted to all facilities within larger geographic area on       Patient informed that his/her managed care company has contracts with or will negotiate with certain facilities, including the following:        Yes   Patient/family informed of bed offers received.  Patient chooses bed at [facility])     Physician recommends and patient chooses bed at [facility])    Patient to be transferred to [facility]) on 06/15/17.  Patient to be transferred to facility by EMS     Patient family notified on 06/15/17 of transfer.  Name of family member notified:  Daughter Melina Schools     PHYSICIAN Please sign DNR       Additional Comment:    _______________________________________________ Zettie Pho, LCSW 06/15/2017, 9:59 AM

## 2017-06-15 NOTE — Discharge Summary (Signed)
[facility] [place] NAME: Tyler Kirk    MR#:  [id number]  DATE OF BIRTH:  [birth year]  DATE OF ADMISSION:  06/13/2017 ADMITTING PHYSICIAN: Demetrios Loll, MD  DATE OF DISCHARGE: 06/15/2017  PRIMARY CARE PHYSICIAN: Jerrol Banana., MD    ADMISSION DIAGNOSIS:  Hyperglycemia [R73.9] Right hip pain [M25.551] Abrasion of left lower extremity, initial encounter [S80.812A] Recurrent syncope [R55]  DISCHARGE DIAGNOSIS:  Active Problems:   Syncope   SECONDARY DIAGNOSIS:   Past Medical History:  Diagnosis Date  . Afib ([place])   . Arthritis   . BPH (benign prostatic hypertrophy)   . Cataracts, bilateral   . Depression   . Diabetes mellitus without complication ([place])   . GERD (gastroesophageal reflux disease)   . Hernia, inguinal    Chronic Large Inguinal Hernia  . Hyperlipidemia   . Hypertension   . Prostate cancer ([place])   . Pulmonary embolism ([place])   . UTI (urinary tract infection)     HOSPITAL COURSE:  83 year old male with a history of  PAF and diabetes who presented to the ED after syncopal fall.  1. Syncope: This is not cardiac in nature. This was vasovagal in nature due to pain and dehydration. There were no acute abnormalities noted on telemetry. He can follow up with Cardiology for results of ECHO that have not been read as of this discharge summary. There would be no change to medications or plan  with the ECHO results for this patient. PT has recommeded SNF which the patient is agreeable too.  2. Back pain after mechanical fall: Patient has no vertebral tenderness Continue when necessary pain medications   3. Dehydration/acute kidney injury: This is improved with IV fluids. LAsix has been stopped.  4. Diabetes: Continue ADA diet 5. BPH: Continue tamsulosin 6. PAF: Metoprolol started for better HR control     DISCHARGE CONDITIONS AND DIET:   Stable ADA DIET  CONSULTS OBTAINED:    DRUG ALLERGIES:    Allergies  Allergen Reactions  . Celexa [Citalopram Hydrobromide]     diarrhea    DISCHARGE MEDICATIONS:   Allergies as of 06/15/2017      Reactions   Celexa [citalopram Hydrobromide]    diarrhea      Medication List    STOP taking these medications   furosemide 20 MG tablet Commonly known as:  LASIX   isosorbide mononitrate 30 MG 24 hr tablet Commonly known as:  IMDUR     TAKE these medications   acetaminophen 325 MG tablet Commonly known as:  TYLENOL Take 2 tablets (650 mg total) by mouth every 6 (six) hours as needed for mild pain (or Fever >/= 101).   aspirin 81 MG tablet Take 1 tablet (81 mg total) by mouth daily.   docusate sodium 250 MG capsule Commonly known as:  COLACE Take 250 mg by mouth daily as needed.   fluticasone 50 MCG/ACT nasal spray Commonly known as:  FLONASE Place 2 sprays into the nose daily as needed.   glucose blood test strip Commonly known as:  ONE TOUCH ULTRA TEST 1 each by Other route every morning. Use as instructed Dx:E11.9   HYDROcodone-acetaminophen 5-325 MG tablet Commonly known as:  NORCO/VICODIN Take 0.5 tablets by mouth every 6 (six) hours as needed for moderate pain.   metoprolol tartrate 25 MG tablet Commonly known as:  LOPRESSOR Take 0.5 tablets (12.5 mg total) by mouth 2 (two) times daily.   mometasone 0.1 % cream  Commonly known as:  ELOCON Apply 1 application topically 2 (two) times daily as needed.   Multiple Vitamin tablet Take 1 tablet by mouth daily.   tamsulosin 0.4 MG Caps capsule Commonly known as:  FLOMAX Take 1 capsule by mouth daily.         Today   CHIEF COMPLAINT:  DOING WELL THIS AM    VITAL SIGNS:  Blood pressure (!) 149/75, pulse 79, temperature 98.1 F (36.7 C), temperature source Oral, resp. rate 18, height 5\' 8"  (1.727 m), weight 74.3 kg (163 lb 12.8 oz), SpO2 93 %.   REVIEW OF SYSTEMS:  Review of Systems  Constitutional: Negative.  Negative for chills, fever and  malaise/fatigue.  HENT: Negative.  Negative for ear discharge, ear pain, hearing loss, nosebleeds and sore throat.   Eyes: Negative.  Negative for blurred vision and pain.  Respiratory: Negative.  Negative for cough, hemoptysis, shortness of breath and wheezing.   Cardiovascular: Negative.  Negative for chest pain, palpitations and leg swelling.  Gastrointestinal: Negative.  Negative for abdominal pain, blood in stool, diarrhea, nausea and vomiting.  Genitourinary: Negative.  Negative for dysuria.  Musculoskeletal: Positive for back pain and falls.  Skin: Negative.   Neurological: Negative for dizziness, tremors, speech change, focal weakness, seizures and headaches.  Endo/Heme/Allergies: Negative.  Does not bruise/bleed easily.  Psychiatric/Behavioral: Negative.  Negative for depression, hallucinations and suicidal ideas.     PHYSICAL EXAMINATION:  GENERAL:  83 y.o.-year-old patient lying in the bed with no acute distress.  NECK:  Supple, no jugular venous distention. No thyroid enlargement, no tenderness.  LUNGS: Normal breath sounds bilaterally, no wheezing, rales,rhonchi  No use of accessory muscles of respiration.  CARDIOVASCULAR: S1, S2 normal. No murmurs, rubs, or gallops.  ABDOMEN: Soft, non-tender, non-distended. Bowel sounds present. No organomegaly or mass.  EXTREMITIES: No pedal edema, cyanosis, or clubbing.  PSYCHIATRIC: The patient is alert and oriented x 3.  SKIN: abrasions noted on knee  DATA REVIEW:   CBC Recent Labs  Lab 06/14/17 0510  WBC 7.8  HGB 13.4  HCT 40.8  PLT 225    Chemistries  Recent Labs  Lab 06/13/17 1710 06/14/17 0510  NA 139 140  K 4.3 3.8  CL 106 110  CO2 24 21*  GLUCOSE 251* 177*  BUN 31* 33*  CREATININE 1.04 0.84  CALCIUM 9.1 8.5*  AST 35  --   ALT 23  --   ALKPHOS 74  --   BILITOT 0.8  --     Cardiac Enzymes Recent Labs  Lab 06/12/17 1657 06/13/17 1215 06/13/17 1710  TROPONINI 0.01 <0.03 <0.03    Microbiology  Results  @[id number] @  RADIOLOGY:  Dg Chest 2 View  Result Date: 06/13/2017 CLINICAL DATA:  Syncope. EXAM: CHEST  2 VIEW COMPARISON:  06/13/2017 FINDINGS: Normal heart size. No pleural effusion. Asymmetric elevation of right hemidiaphragm. Reticular opacities within the right upper lobe and throughout the left lung are unchanged from previous exam. IMPRESSION: No change in bilateral reticular interstitial opacities compared with previous exam. Electronically Signed   By: Kerby Moors M.D.   On: 06/13/2017 17:20   Dg Chest 2 View  Result Date: 06/13/2017 CLINICAL DATA:  Short of breath. Right-sided pain. Fall 2 days ago. EXAM: CHEST  2 VIEW COMPARISON:  11/20/2010 FINDINGS: Lateral view degraded by patient arm position. Moderate right hemidiaphragm elevation. Midline trachea. Mild cardiomegaly. Tortuous thoracic aorta. Atherosclerosis in the transverse aorta. No pleural effusion or pneumothorax. Right upper lobe interstitial opacity is progressive  since the prior chest radiograph. interstitial thickening at the left lung base which is favored to represent scarring and may be slightly increased. IMPRESSION: Since 2012, significant interval progression of right upper lobe reticular opacity. Although this is not a typical appearance of acute pneumonia, this cannot be excluded. Followup PA and lateral chest X-ray is recommended in 3-4 weeks following trial of antibiotic therapy to ensure resolution and exclude underlying malignancy. Cardiomegaly without congestive failure. Aortic and branch vessel atherosclerosis. Electronically Signed   By: Abigail Miyamoto M.D.   On: 06/13/2017 12:54   Dg Tibia/fibula Left  Result Date: 06/13/2017 CLINICAL DATA:  Left leg pain after syncopal episode. EXAM: LEFT TIBIA AND FIBULA - 2 VIEW COMPARISON:  None. FINDINGS: There is no evidence of fracture, joint dislocations or other focal bone lesions. Soft tissues are unremarkable. Tibial arteriosclerosis is noted.  IMPRESSION: Negative for acute fracture nor joint dislocations. Electronically Signed   By: Ashley Royalty M.D.   On: 06/13/2017 17:20   Ct Head Wo Contrast  Result Date: 06/13/2017 CLINICAL DATA:  Syncopal episode with altered level of consciousness. Patient received IV contrast for chest CT prior to the study. EXAM: CT HEAD WITHOUT CONTRAST TECHNIQUE: Contiguous axial images were obtained from the base of the skull through the vertex without intravenous contrast. COMPARISON:  None. FINDINGS: Brain: Atrophy with chronic moderate appearing small vessel ischemia of periventricular white matter. No intra-axial mass nor extra-axial fluid collections. No hydrocephalus. No effacement of the basal cisterns or fourth ventricle. Vascular: Atherosclerotic calcifications of the carotid siphons. Hyperdense appearance of the intracranial vessels would be in keeping with recent IV contrast administration for the patient's chest CT earlier in the day. Mild prominence of the right MCA at the takeoff of the posterior communicating artery likely from a normal posterior communicating infundibulum. No definite aneurysm. Skull: Negative for fracture. Sinuses/Orbits: Mild mucosal thickening of the maxillary sinuses with possible mucous retention cysts of the right maxillary sinus. Bilateral cataract extractions. Other: None IMPRESSION: 1. Atrophy with chronic appearing moderate small vessel ischemic disease of periventricular white matter. 2. No acute intracranial abnormality. Electronically Signed   By: Ashley Royalty M.D.   On: 06/13/2017 17:30   Ct Angio Chest Pe W And/or Wo Contrast  Result Date: 06/13/2017 CLINICAL DATA:  Shortness of breath and right-sided chest pain after a fall 2 days ago. EXAM: CT ANGIOGRAPHY CHEST WITH CONTRAST TECHNIQUE: Multidetector CT imaging of the chest was performed using the standard protocol during bolus administration of intravenous contrast. Multiplanar CT image reconstructions and MIPs were  obtained to evaluate the vascular anatomy. CONTRAST:  42mL ISOVUE-370 IOPAMIDOL (ISOVUE-370) INJECTION 76% COMPARISON:  None. FINDINGS: Cardiovascular: The heart is normal in size for age. No pericardial effusion. There is tortuosity and mild ectasia of the thoracic aorta but no aneurysm. The pulmonary arteries are enlarged suggesting pulmonary hypertension. There are well opacified and no filling defects are identified to suggest pulmonary embolism. Mediastinum/Nodes: Small mediastinal and right hilar lymph nodes likely inflammatory/ hyperplastic. No mass or overt adenopathy. The esophagus is grossly normal. Lungs/Pleura: Mosaic pattern of attenuation in the lungs could be due to acute bronchiolitis, constrictive bronchiolitis, reactive airways disease, cryptogenic organizing pneumonia or hypersensitivity pneumonitis. No focal airspace consolidation or pleural effusion. Upper Abdomen: Dilated colon is noted with colonic interposition. Pancreatic calcifications suggesting chronic calcific pancreatitis. Musculoskeletal: No chest wall mass, supraclavicular or axillary lymphadenopathy. No acute bony findings. No definite sternal, thoracic vertebral body or rib fractures. Review of the MIP images confirms the above  findings. IMPRESSION: 1. No CT findings for pulmonary embolism. 2. Enlarged pulmonary arteries suggesting pulmonary hypertension. 3. Mosaic pattern of lung attenuation.  Please see above discussion. 4. No focal infiltrates or effusions. 5. Intact bony thorax. Aortic Atherosclerosis (ICD10-I70.0). Electronically Signed   By: Marijo Sanes M.D.   On: 06/13/2017 14:27   Dg Knee Complete 4 Views Left  Result Date: 06/13/2017 CLINICAL DATA:  Syncopal episode with left knee pain. EXAM: LEFT KNEE - COMPLETE 4+ VIEW COMPARISON:  None. FINDINGS: No evidence of fracture, dislocation, or joint effusion. Slight medial femorotibial joint space narrowing likely representing mild cartilaginous thinning. Soft tissues  are unremarkable. IMPRESSION: Negative for acute fracture or joint dislocation. Slight medial femorotibial joint space narrowing which may reflect chondral thinning. Electronically Signed   By: Ashley Royalty M.D.   On: 06/13/2017 17:21   Dg Hip Unilat W Or Wo Pelvis 2-3 Views Right  Result Date: 06/13/2017 CLINICAL DATA:  Syncopal episode. EXAM: DG HIP (WITH OR WITHOUT PELVIS) 2-3V RIGHT COMPARISON:  05/09/2016. FINDINGS: Degenerative changes lumbar spine and both hips. Diffuse osteopenia No acute bony abnormality identified. No evidence of fracture. Contrast in the bladder, presumably from prior CT. Bladder diverticuli noted. Prostate enlargement. Peripheral vascular calcification. IMPRESSION: No acute bony abnormality identified. No evidence fracture. Degenerative changes lumbar spine and both hips. 2. Contrast noted in the bladder, presumably from prior CT. Bladder diverticuli noted. Prostate is prominent. 3. Peripheral vascular disease. Electronically Signed   By: Marcello Moores  Register   On: 06/13/2017 17:23      Allergies as of 06/15/2017      Reactions   Celexa [citalopram Hydrobromide]    diarrhea      Medication List    STOP taking these medications   furosemide 20 MG tablet Commonly known as:  LASIX   isosorbide mononitrate 30 MG 24 hr tablet Commonly known as:  IMDUR     TAKE these medications   acetaminophen 325 MG tablet Commonly known as:  TYLENOL Take 2 tablets (650 mg total) by mouth every 6 (six) hours as needed for mild pain (or Fever >/= 101).   aspirin 81 MG tablet Take 1 tablet (81 mg total) by mouth daily.   docusate sodium 250 MG capsule Commonly known as:  COLACE Take 250 mg by mouth daily as needed.   fluticasone 50 MCG/ACT nasal spray Commonly known as:  FLONASE Place 2 sprays into the nose daily as needed.   glucose blood test strip Commonly known as:  ONE TOUCH ULTRA TEST 1 each by Other route every morning. Use as instructed Dx:E11.9    HYDROcodone-acetaminophen 5-325 MG tablet Commonly known as:  NORCO/VICODIN Take 0.5 tablets by mouth every 6 (six) hours as needed for moderate pain.   metoprolol tartrate 25 MG tablet Commonly known as:  LOPRESSOR Take 0.5 tablets (12.5 mg total) by mouth 2 (two) times daily.   mometasone 0.1 % cream Commonly known as:  ELOCON Apply 1 application topically 2 (two) times daily as needed.   Multiple Vitamin tablet Take 1 tablet by mouth daily.   tamsulosin 0.4 MG Caps capsule Commonly known as:  FLOMAX Take 1 capsule by mouth daily.          Management plans discussed with the patient and he is in agreement. Stable for discharge snf  Patient should follow up with pcp  CODE STATUS:     Code Status Orders  (From admission, onward)        Start  Ordered   06/13/17 2051  Do not attempt resuscitation (DNR)  Continuous    Question Answer Comment  In the event of cardiac or respiratory ARREST Do not call a "code blue"   In the event of cardiac or respiratory ARREST Do not perform Intubation, CPR, defibrillation or ACLS   In the event of cardiac or respiratory ARREST Use medication by any route, position, wound care, and other measures to relive pain and suffering. May use oxygen, suction and manual treatment of airway obstruction as needed for comfort.      06/13/17 2050    Code Status History    Date Active Date Inactive Code Status Order ID Comments User Context   This patient has a current code status but no historical code status.    Advance Directive Documentation     Most Recent Value  Type of Advance Directive  Healthcare Power of Attorney, Living will  Pre-existing out of facility DNR order (yellow form or pink MOST form)  No data  "MOST" Form in Place?  No data      TOTAL TIME TAKING CARE OF THIS PATIENT: 37 minutes.    Note: This dictation was prepared with Dragon dictation along with smaller phrase technology. Any transcriptional errors that  result from this process are unintentional.  Naara Kelty M.D on 06/15/2017 at 9:20 AM  Between 7am to 6pm - Pager - [phone] After 6pm go to [email or web address] - password [email or web address] [facility]  Office  [phone]  CC: Primary care physician; Jerrol Banana., MD

## 2017-06-15 NOTE — Clinical Social Work Note (Signed)
Patient will discharge to [facility] via non-emergent EMS due to continued weakness and significant falls risk. The patient, his family, and the facility are aware and in agreement. CSW has sent all needed documentation to the facility and will deliver the discharge packet to the chart when able. The RN can call report to [phone]. CSW is signing off. Please consult should additional needs arise.  Santiago Bumpers, MSW, Latanya Presser [phone]

## 2017-06-16 ENCOUNTER — Telehealth: Payer: Self-pay | Admitting: Family Medicine

## 2017-06-16 NOTE — Telephone Encounter (Signed)
Dr [name] please review, last note is not finished. OK for home Health? If so what reason and the office note will need to say this need also and details thank Edrick Kins, RMA

## 2017-06-16 NOTE — Telephone Encounter (Signed)
Pt son-in-law Randa Ngo called to ask ing a referral has been sent so pt can have home health.  He states pt is in a nursing home now but if pt had home health they would keep him in his home.  ZB#[phone]/VT  He also canceled pt appointment for tomorrow/MW

## 2017-06-17 ENCOUNTER — Other Ambulatory Visit: Payer: Self-pay | Admitting: Emergency Medicine

## 2017-06-17 ENCOUNTER — Ambulatory Visit: Payer: Self-pay | Admitting: Family Medicine

## 2017-06-17 DIAGNOSIS — R2681 Unsteadiness on feet: Secondary | ICD-10-CM

## 2017-06-17 DIAGNOSIS — W19XXXA Unspecified fall, initial encounter: Secondary | ICD-10-CM

## 2017-06-17 DIAGNOSIS — R413 Other amnesia: Secondary | ICD-10-CM

## 2017-08-04 ENCOUNTER — Telehealth: Payer: Self-pay | Admitting: Family Medicine

## 2017-08-04 NOTE — Telephone Encounter (Signed)
Becky dropped off pt's [facility] Medication Review Form. Form has been placed in Dr. Alben Spittle box. Please advise. Thanks TNP

## 2017-08-04 NOTE — Telephone Encounter (Signed)
Form placed in you stack to review. Please advise. Thanks.

## 2017-08-07 ENCOUNTER — Inpatient Hospital Stay
Admission: EM | Admit: 2017-08-07 | Discharge: 2017-08-11 | DRG: 193 | Disposition: A | Discharge status: Skilled Nursing Facility | Payer: Medicare Other | Attending: Internal Medicine | Admitting: Internal Medicine

## 2017-08-07 ENCOUNTER — Emergency Department: Payer: Medicare Other

## 2017-08-07 ENCOUNTER — Encounter: Payer: Self-pay | Admitting: Emergency Medicine

## 2017-08-07 ENCOUNTER — Other Ambulatory Visit: Payer: Self-pay | Admitting: Emergency Medicine

## 2017-08-07 ENCOUNTER — Other Ambulatory Visit: Payer: Self-pay

## 2017-08-07 DIAGNOSIS — I4891 Unspecified atrial fibrillation: Secondary | ICD-10-CM | POA: Diagnosis present

## 2017-08-07 DIAGNOSIS — F329 Major depressive disorder, single episode, unspecified: Secondary | ICD-10-CM | POA: Diagnosis present

## 2017-08-07 DIAGNOSIS — J9621 Acute and chronic respiratory failure with hypoxia: Secondary | ICD-10-CM | POA: Diagnosis present

## 2017-08-07 DIAGNOSIS — J309 Allergic rhinitis, unspecified: Secondary | ICD-10-CM | POA: Diagnosis present

## 2017-08-07 DIAGNOSIS — L8992 Pressure ulcer of unspecified site, stage 2: Secondary | ICD-10-CM | POA: Diagnosis present

## 2017-08-07 DIAGNOSIS — R05 Cough: Secondary | ICD-10-CM

## 2017-08-07 DIAGNOSIS — E43 Unspecified severe protein-calorie malnutrition: Secondary | ICD-10-CM | POA: Diagnosis present

## 2017-08-07 DIAGNOSIS — J189 Pneumonia, unspecified organism: Secondary | ICD-10-CM | POA: Diagnosis present

## 2017-08-07 DIAGNOSIS — G3184 Mild cognitive impairment, so stated: Secondary | ICD-10-CM

## 2017-08-07 DIAGNOSIS — Z86711 Personal history of pulmonary embolism: Secondary | ICD-10-CM | POA: Diagnosis not present

## 2017-08-07 DIAGNOSIS — Z9079 Acquired absence of other genital organ(s): Secondary | ICD-10-CM

## 2017-08-07 DIAGNOSIS — K219 Gastro-esophageal reflux disease without esophagitis: Secondary | ICD-10-CM | POA: Diagnosis present

## 2017-08-07 DIAGNOSIS — R627 Adult failure to thrive: Secondary | ICD-10-CM

## 2017-08-07 DIAGNOSIS — F0390 Unspecified dementia without behavioral disturbance: Secondary | ICD-10-CM | POA: Diagnosis present

## 2017-08-07 DIAGNOSIS — Y95 Nosocomial condition: Secondary | ICD-10-CM | POA: Diagnosis present

## 2017-08-07 DIAGNOSIS — N390 Urinary tract infection, site not specified: Secondary | ICD-10-CM | POA: Diagnosis present

## 2017-08-07 DIAGNOSIS — Z7951 Long term (current) use of inhaled steroids: Secondary | ICD-10-CM

## 2017-08-07 DIAGNOSIS — Z888 Allergy status to other drugs, medicaments and biological substances status: Secondary | ICD-10-CM

## 2017-08-07 DIAGNOSIS — Z6821 Body mass index (BMI) 21.0-21.9, adult: Secondary | ICD-10-CM

## 2017-08-07 DIAGNOSIS — H918X3 Other specified hearing loss, bilateral: Secondary | ICD-10-CM | POA: Diagnosis present

## 2017-08-07 DIAGNOSIS — Z66 Do not resuscitate: Secondary | ICD-10-CM | POA: Diagnosis present

## 2017-08-07 DIAGNOSIS — E860 Dehydration: Secondary | ICD-10-CM | POA: Diagnosis present

## 2017-08-07 DIAGNOSIS — R059 Cough, unspecified: Secondary | ICD-10-CM

## 2017-08-07 DIAGNOSIS — Z8249 Family history of ischemic heart disease and other diseases of the circulatory system: Secondary | ICD-10-CM

## 2017-08-07 DIAGNOSIS — Z8546 Personal history of malignant neoplasm of prostate: Secondary | ICD-10-CM | POA: Diagnosis not present

## 2017-08-07 DIAGNOSIS — N400 Enlarged prostate without lower urinary tract symptoms: Secondary | ICD-10-CM | POA: Diagnosis present

## 2017-08-07 DIAGNOSIS — Z85828 Personal history of other malignant neoplasm of skin: Secondary | ICD-10-CM | POA: Diagnosis not present

## 2017-08-07 DIAGNOSIS — Z7982 Long term (current) use of aspirin: Secondary | ICD-10-CM

## 2017-08-07 DIAGNOSIS — Z79899 Other long term (current) drug therapy: Secondary | ICD-10-CM

## 2017-08-07 DIAGNOSIS — I10 Essential (primary) hypertension: Secondary | ICD-10-CM | POA: Diagnosis present

## 2017-08-07 DIAGNOSIS — Z22322 Carrier or suspected carrier of Methicillin resistant Staphylococcus aureus: Secondary | ICD-10-CM

## 2017-08-07 DIAGNOSIS — J440 Chronic obstructive pulmonary disease with acute lower respiratory infection: Secondary | ICD-10-CM | POA: Diagnosis present

## 2017-08-07 DIAGNOSIS — E785 Hyperlipidemia, unspecified: Secondary | ICD-10-CM | POA: Diagnosis present

## 2017-08-07 DIAGNOSIS — E119 Type 2 diabetes mellitus without complications: Secondary | ICD-10-CM | POA: Diagnosis present

## 2017-08-07 DIAGNOSIS — L8990 Pressure ulcer of unspecified site, unspecified stage: Secondary | ICD-10-CM

## 2017-08-07 DIAGNOSIS — J441 Chronic obstructive pulmonary disease with (acute) exacerbation: Secondary | ICD-10-CM | POA: Diagnosis present

## 2017-08-07 DIAGNOSIS — J9601 Acute respiratory failure with hypoxia: Secondary | ICD-10-CM

## 2017-08-07 DIAGNOSIS — Z8701 Personal history of pneumonia (recurrent): Secondary | ICD-10-CM

## 2017-08-07 DIAGNOSIS — Z7984 Long term (current) use of oral hypoglycemic drugs: Secondary | ICD-10-CM

## 2017-08-07 DIAGNOSIS — Z9981 Dependence on supplemental oxygen: Secondary | ICD-10-CM

## 2017-08-07 LAB — BASIC METABOLIC PANEL
ANION GAP: 8 (ref 5–15)
BUN: 36 mg/dL — ABNORMAL HIGH (ref 6–20)
CHLORIDE: 103 mmol/L (ref 101–111)
CO2: 30 mmol/L (ref 22–32)
CREATININE: 0.84 mg/dL (ref 0.61–1.24)
Calcium: 9.2 mg/dL (ref 8.9–10.3)
GFR calc Af Amer: >60 mL/min (ref >60)
GFR calc non Af Amer: >60 mL/min (ref >60)
Glucose, Bld: 232 mg/dL — ABNORMAL HIGH (ref 65–99)
Potassium: 4.2 mmol/L (ref 3.5–5.1)
Sodium: 141 mmol/L (ref 135–145)

## 2017-08-07 LAB — URINALYSIS, COMPLETE (UACMP) WITH MICROSCOPIC
BACTERIA UA: NONE SEEN
Bilirubin Urine: NEGATIVE
Glucose, UA: NEGATIVE mg/dL
KETONES UR: NEGATIVE mg/dL
Nitrite: NEGATIVE
PROTEIN: 100 mg/dL — AB
SQUAMOUS EPITHELIAL / LPF: NONE SEEN
Specific Gravity, Urine: 1.027 (ref 1.005–1.030)
pH: 5 (ref 5.0–8.0)

## 2017-08-07 LAB — CBC
HCT: 44 % (ref 40.0–52.0)
HEMOGLOBIN: 14.1 g/dL (ref 13.0–18.0)
MCH: 27.8 pg (ref 26.0–34.0)
MCHC: 31.9 g/dL — ABNORMAL LOW (ref 32.0–36.0)
MCV: 87.1 fL (ref 80.0–100.0)
Platelets: 266 10*3/uL (ref 150–440)
RBC: 5.05 MIL/uL (ref 4.40–5.90)
RDW: 14.6 % — ABNORMAL HIGH (ref 11.5–14.5)
WBC: 10.8 10*3/uL — ABNORMAL HIGH (ref 3.8–10.6)

## 2017-08-07 LAB — INFLUENZA PANEL BY PCR (TYPE A & B)
INFLAPCR: NEGATIVE
INFLBPCR: NEGATIVE

## 2017-08-07 LAB — GLUCOSE, CAPILLARY: Glucose-Capillary: 197 mg/dL — ABNORMAL HIGH (ref 65–99)

## 2017-08-07 LAB — TROPONIN I: Troponin I: 0.03 ng/mL (ref <0.03)

## 2017-08-07 MED ORDER — SENNOSIDES-DOCUSATE SODIUM 8.6-50 MG PO TABS: 1.0000 | ORAL_TABLET | Freq: Every evening | ORAL | Status: DC | PRN

## 2017-08-07 MED ORDER — VANCOMYCIN HCL IN DEXTROSE 1-5 GM/200ML-% IV SOLN
1000.0000 mg | Freq: Once | INTRAVENOUS | Status: AC
  Administered 2017-08-07: 1000 mg via INTRAVENOUS
  Filled 2017-08-07: qty 200

## 2017-08-07 MED ORDER — RISAQUAD PO CAPS
1.0000 | ORAL_CAPSULE | Freq: Every day | ORAL | Status: DC
Start: 2017-08-07 — End: 2017-08-11
  Administered 2017-08-08 – 2017-08-11 (×4): 1 via ORAL
  Filled 2017-08-07 (×4): qty 1

## 2017-08-07 MED ORDER — INSULIN ASPART 100 UNIT/ML ~~LOC~~ SOLN
0.0000 [IU] | Freq: Three times a day (TID) | SUBCUTANEOUS | Status: DC
Start: 2017-08-08 — End: 2017-08-11
  Administered 2017-08-08 (×3): 1 [IU] via SUBCUTANEOUS
  Administered 2017-08-09: 2 [IU] via SUBCUTANEOUS
  Administered 2017-08-09: 12:00:00 1 [IU] via SUBCUTANEOUS
  Administered 2017-08-09 – 2017-08-10 (×2): 2 [IU] via SUBCUTANEOUS
  Administered 2017-08-10: 17:00:00 3 [IU] via SUBCUTANEOUS
  Administered 2017-08-10 – 2017-08-11 (×2): 1 [IU] via SUBCUTANEOUS
  Administered 2017-08-11: 12:00:00 3 [IU] via SUBCUTANEOUS
  Filled 2017-08-07 (×11): qty 1

## 2017-08-07 MED ORDER — INSULIN ASPART 100 UNIT/ML ~~LOC~~ SOLN
0.0000 [IU] | Freq: Every day | SUBCUTANEOUS | Status: DC
Start: 2017-08-07 — End: 2017-08-11
  Administered 2017-08-10: 23:00:00 3 [IU] via SUBCUTANEOUS
  Filled 2017-08-07 (×2): qty 1

## 2017-08-07 MED ORDER — ACETAMINOPHEN 650 MG RE SUPP: 650.0000 mg | Freq: Four times a day (QID) | RECTAL | Status: DC | PRN

## 2017-08-07 MED ORDER — BISACODYL 5 MG PO TBEC
5.0000 mg | DELAYED_RELEASE_TABLET | Freq: Every day | ORAL | Status: DC | PRN
  Administered 2017-08-10: 5 mg via ORAL
  Filled 2017-08-07: qty 1

## 2017-08-07 MED ORDER — ONDANSETRON HCL 4 MG PO TABS: 4.0000 mg | ORAL_TABLET | Freq: Four times a day (QID) | ORAL | Status: DC | PRN

## 2017-08-07 MED ORDER — ONDANSETRON HCL 4 MG/2ML IJ SOLN: 4.0000 mg | Freq: Four times a day (QID) | INTRAMUSCULAR | Status: DC | PRN

## 2017-08-07 MED ORDER — ENOXAPARIN SODIUM 40 MG/0.4ML ~~LOC~~ SOLN
40.0000 mg | SUBCUTANEOUS | Status: DC
  Administered 2017-08-08 – 2017-08-10 (×3): 40 mg via SUBCUTANEOUS
  Filled 2017-08-07 (×4): qty 0.4

## 2017-08-07 MED ORDER — CARVEDILOL 3.125 MG PO TABS
3.1250 mg | ORAL_TABLET | Freq: Two times a day (BID) | ORAL | Status: DC
  Administered 2017-08-08 – 2017-08-11 (×7): 3.125 mg via ORAL
  Filled 2017-08-07 (×7): qty 1

## 2017-08-07 MED ORDER — ALBUTEROL SULFATE (2.5 MG/3ML) 0.083% IN NEBU: 2.5000 mg | INHALATION_SOLUTION | RESPIRATORY_TRACT | Status: DC | PRN

## 2017-08-07 MED ORDER — DOCUSATE SODIUM 100 MG PO CAPS
200.0000 mg | ORAL_CAPSULE | Freq: Two times a day (BID) | ORAL | Status: DC
  Administered 2017-08-07 – 2017-08-11 (×8): 200 mg via ORAL
  Filled 2017-08-07 (×8): qty 2

## 2017-08-07 MED ORDER — DEXTROSE 5 % IV SOLN
1.0000 g | Freq: Once | INTRAVENOUS | Status: AC
  Administered 2017-08-07: 1 g via INTRAVENOUS
  Filled 2017-08-07: qty 1

## 2017-08-07 MED ORDER — IPRATROPIUM-ALBUTEROL 0.5-2.5 (3) MG/3ML IN SOLN
3.0000 mL | Freq: Once | RESPIRATORY_TRACT | Status: AC
  Administered 2017-08-07: 3 mL via RESPIRATORY_TRACT
  Filled 2017-08-07: qty 3

## 2017-08-07 MED ORDER — HYDROCODONE-ACETAMINOPHEN 5-325 MG PO TABS: 1.0000 | ORAL_TABLET | ORAL | Status: DC | PRN

## 2017-08-07 MED ORDER — ASPIRIN EC 81 MG PO TBEC
81.0000 mg | DELAYED_RELEASE_TABLET | Freq: Every day | ORAL | Status: DC
  Administered 2017-08-08 – 2017-08-11 (×4): 81 mg via ORAL
  Filled 2017-08-07 (×4): qty 1

## 2017-08-07 MED ORDER — TAMSULOSIN HCL 0.4 MG PO CAPS
0.4000 mg | ORAL_CAPSULE | Freq: Every day | ORAL | Status: DC
  Administered 2017-08-08 – 2017-08-11 (×4): 0.4 mg via ORAL
  Filled 2017-08-07 (×4): qty 1

## 2017-08-07 MED ORDER — SODIUM CHLORIDE 0.9 % IV SOLN
1250.0000 mg | INTRAVENOUS | Status: DC
  Administered 2017-08-08 – 2017-08-09 (×2): 1250 mg via INTRAVENOUS
  Filled 2017-08-07 (×2): qty 1250

## 2017-08-07 MED ORDER — ACETAMINOPHEN 325 MG PO TABS
650.0000 mg | ORAL_TABLET | Freq: Four times a day (QID) | ORAL | Status: DC | PRN
  Administered 2017-08-07 – 2017-08-08 (×2): 650 mg via ORAL
  Filled 2017-08-07 (×4): qty 2

## 2017-08-07 MED ORDER — LISINOPRIL 5 MG PO TABS
2.5000 mg | ORAL_TABLET | Freq: Every day | ORAL | Status: DC
  Administered 2017-08-08 – 2017-08-11 (×4): 2.5 mg via ORAL
  Filled 2017-08-07 (×4): qty 1

## 2017-08-07 MED ORDER — SODIUM CHLORIDE 0.9 % IV SOLN
INTRAVENOUS | Status: DC
  Administered 2017-08-07: 21:00:00 via INTRAVENOUS

## 2017-08-07 MED ORDER — DOXYCYCLINE HYCLATE 100 MG PO TABS
100.0000 mg | ORAL_TABLET | Freq: Once | ORAL | Status: AC
  Administered 2017-08-07: 100 mg via ORAL
  Filled 2017-08-07: qty 1

## 2017-08-07 NOTE — Progress Notes (Signed)
Pharmacy Antibiotic Note  Tyler Kirk is a 84 y.o. male admitted on 08/07/2017 with pneumonia.  Pharmacy has been consulted for vancomyicn dosing.  Plan: Vancomycin 1250mg  IV every 24 hours.  Goal trough 15-20 mcg/mL. Vancomycin trough 2/11@0230    Height: 5\' 9"  (175.3 cm) Weight: 142 lb (64.4 kg) IBW/kg (Calculated) : 70.7  Temp (24hrs), Avg:98 F (36.7 C), Min:98 F (36.7 C), Max:98 F (36.7 C)  Recent Labs  Lab 08/07/17 1551  WBC 10.8*  CREATININE 0.84    Estimated Creatinine Clearance: 47.9 mL/min (by C-G formula based on SCr of 0.84 mg/dL).    Allergies  Allergen Reactions  . Celexa [Citalopram Hydrobromide]     diarrhea    Antimicrobials this admission: Anti-infectives (From admission, onward)   Start     Dose/Rate Route Frequency Ordered Stop   08/08/17 0300  vancomycin (VANCOCIN) 1,250 mg in sodium chloride 0.9 % 250 mL IVPB     1,250 mg 166.7 mL/hr over 90 Minutes Intravenous Every 24 hours 08/07/17 1938     08/07/17 1715  ceFEPIme (MAXIPIME) 1 g in dextrose 5 % 50 mL IVPB     1 g 100 mL/hr over 30 Minutes Intravenous  Once 08/07/17 1700 08/07/17 1800   08/07/17 1715  doxycycline (VIBRA-TABS) tablet 100 mg     100 mg Oral  Once 08/07/17 1700 08/07/17 1730   08/07/17 1715  vancomycin (VANCOCIN) IVPB 1000 mg/200 mL premix     1,000 mg 200 mL/hr over 60 Minutes Intravenous  Once 08/07/17 1701 08/07/17 1921      No results found for this or any previous visit (from the past 240 hour(s)).   Thank you for allowing pharmacy to be a part of this patient's care.  Donna Christen Basheer Molchan 08/07/2017 7:38 PM

## 2017-08-07 NOTE — ED Provider Notes (Signed)
[facility] Emergency Department Provider Note  ____________________________________________  Time seen: Approximately 4:21 PM  I have reviewed the triage vital signs and the nursing notes.   HISTORY  Chief Complaint Weakness   HPI Tyler Kirk is a 84 y.o. male with a history of dementia, diabetes, hypertension, hyperlipidemia, atrial fibrillation who presents for evaluation of generalized weakness. Patient reports that he has had a cough productive of yellow sputum for the last 2 weeks. Has had progressively worsening shortness of breath and generalized weakness for several days. Shortness of breath today became severe and constant. He denies chest pain, abdominal pain, nausea, vomiting, diarrhea, dysuria, fever. He has had chills.  Past Medical History:  Diagnosis Date  . Afib ([place])   . Arthritis   . BPH (benign prostatic hypertrophy)   . Cataracts, bilateral   . Depression   . Diabetes mellitus without complication ([place])   . GERD (gastroesophageal reflux disease)   . Hernia, inguinal    Chronic Large Inguinal Hernia  . Hyperlipidemia   . Hypertension   . Prostate cancer ([place])   . Pulmonary embolism ([place])   . UTI (urinary tract infection)     Patient Active Problem List   Diagnosis Date Noted  . Pneumonia 08/07/2017  . Syncope 06/13/2017  . Elevated PSA 04/16/2017  . Urinary incontinence 02/14/2017  . Venous stasis of both lower extremities 02/14/2017  . Allergic rhinitis 02/14/2017  . Eczema 02/14/2017  . Gait instability 02/13/2017  . Microscopic hematuria 11/19/2016  . Arthritis 12/01/2014  . A-fib ([place]) 12/01/2014  . Benign fibroma of prostate 12/01/2014  . Cataract 12/01/2014  . Clinical depression 12/01/2014  . Esophagitis, reflux 12/01/2014  . Fatigue 12/01/2014  . Bilateral hearing loss 12/01/2014  . Hyperplasia, prostate 12/01/2014  . Mild cognitive impairment 12/01/2014  . Hx pulmonary embolism 12/01/2014  . Basal  cell papilloma 12/01/2014  . Hernia, inguinal, unilateral 05/09/2006  . HLD (hyperlipidemia) 03/28/2006  . Diabetes mellitus, type 2 ([place]) 03/28/2006    Past Surgical History:  Procedure Laterality Date  . bladder diverticulum removed  1960  . EYE SURGERY     catarct  . PROSTATE SURGERY     prostate removed  . skin cancer: Removed  Left    Left arm near wrist area  . TOOTH EXTRACTION      Prior to Admission medications   Medication Sig Start Date End Date Taking? Authorizing Provider  acetaminophen (TYLENOL) 325 MG tablet Take 2 tablets (650 mg total) by mouth every 6 (six) hours as needed for mild pain (or Fever >/= 101). 06/15/17  Yes Bettey Costa, MD  aspirin 81 MG tablet Take 1 tablet (81 mg total) by mouth daily. 04/16/17  Yes Plonk, Gwyndolyn Saxon, MD  bifidobacterium infantis (ALIGN) capsule Take 1 capsule by mouth daily.   Yes [provider]  carvedilol (COREG) 3.125 MG tablet Take 3.125 mg by mouth 2 (two) times daily with a meal.   Yes [provider]  docusate sodium (COLACE) 250 MG capsule Take 250 mg by mouth daily as needed.   Yes [provider]  fluticasone (FLONASE) 50 MCG/ACT nasal spray Place 2 sprays into both nostrils daily as needed.    Yes [provider]  glucose blood (ONE TOUCH ULTRA TEST) test strip 1 each by Other route every morning. Use as instructed Dx:E11.9 12/09/16  Yes Jerrol Banana., MD  HYDROcodone-acetaminophen (NORCO/VICODIN) 5-325 MG tablet Take 0.5 tablets by mouth every 6 (six) hours as  needed for moderate pain. 06/15/17  Yes Mody, Sital, MD  ipratropium-albuterol (DUONEB) 0.5-2.5 (3) MG/3ML SOLN Take 3 mLs by nebulization every 4 (four) hours as needed.   Yes [provider]  lisinopril (PRINIVIL,ZESTRIL) 2.5 MG tablet Take 2.5 mg by mouth daily.   Yes [provider]  mometasone (ELOCON) 0.1 % cream Apply 1 application topically 2 (two) times daily as needed. 04/16/17  Yes Plonk, William, MD   Multiple Vitamin tablet Take 1 tablet by mouth daily. 08/01/10  Yes [provider]  sitaGLIPtin (JANUVIA) 50 MG tablet Take 50 mg by mouth daily.   Yes [provider]  tamsulosin (FLOMAX) 0.4 MG CAPS capsule Take 1 capsule by mouth daily.    Yes [provider]  metoprolol tartrate (LOPRESSOR) 25 MG tablet Take 0.5 tablets (12.5 mg total) by mouth 2 (two) times daily. Patient not taking: Reported on 08/07/2017 06/15/17 1Jan 05, 2019  Bettey Costa, MD    Allergies Celexa [citalopram hydrobromide]  Family History  Problem Relation Age of Onset  . Stroke Mother   . Hypertension Mother   . Stroke Father   . Prostate cancer Neg Hx   . Kidney cancer Neg Hx   . Bladder Cancer Neg Hx     Social History Social History   Tobacco Use  . Smoking status: Never Smoker  . Smokeless tobacco: Never Used  Substance Use Topics  . Alcohol use: No  . Drug use: No    Review of Systems  Constitutional: Negative for fever. + chills and generalized weakness Eyes: Negative for visual changes. ENT: Negative for sore throat. Neck: No neck pain  Cardiovascular: Negative for chest pain. Respiratory: + shortness of breath, cough Gastrointestinal: Negative for abdominal pain, vomiting or diarrhea. Genitourinary: Negative for dysuria. Musculoskeletal: Negative for back pain. Skin: Negative for rash. Neurological: Negative for headaches, weakness or numbness. Psych: No SI or HI  ____________________________________________   PHYSICAL EXAM:  VITAL SIGNS: ED Triage Vitals  Enc Vitals Group     BP 08/07/17 1545 135/84     Pulse Rate 08/07/17 1545 92     Resp 08/07/17 1545 (!) 30     Temp 08/07/17 1545 98 F (36.7 C)     Temp Source 08/07/17 1545 Oral     SpO2 08/07/17 1545 94 %     Weight 08/07/17 1547 142 lb (64.4 kg)     Height 08/07/17 1547 5\' 9"  (1.753 m)     Head Circumference --      Peak Flow --      Pain Score --      Pain Loc --      Pain Edu? --       Excl. in [place]? --     Constitutional: Alert and oriented. Well appearing and in no apparent distress. HEENT:      Head: Normocephalic and atraumatic.         Eyes: Conjunctivae are normal. Sclera is non-icteric.       Mouth/Throat: Mucous membranes are dry.       Neck: Supple with no signs of meningismus. Cardiovascular: Regular rate and rhythm. No murmurs, gallops, or rubs. 2+ symmetrical distal pulses are present in all extremities. No JVD. Respiratory: Increased work of breathing, tachypneic, requiring 2 L nasal cannula, faint expiratory wheezes with crackles on the left  Gastrointestinal: Soft, non tender, and non distended with positive bowel sounds. No rebound or guarding. Musculoskeletal: Nontender with normal range of motion in all extremities. No edema, cyanosis, or  erythema of extremities. Neurologic: Normal speech and language. Face is symmetric. Moving all extremities. No gross focal neurologic deficits are appreciated. Skin: Skin is warm, dry and intact. No rash noted. Psychiatric: Mood and affect are normal. Speech and behavior are normal.  ____________________________________________   LABS (all labs ordered are listed, but only abnormal results are displayed)  Labs Reviewed  BASIC METABOLIC PANEL - Abnormal; Notable for the following components:      Result Value   Glucose, Bld 232 (*)    BUN 36 (*)    All other components within normal limits  CBC - Abnormal; Notable for the following components:   WBC 10.8 (*)    MCHC 31.9 (*)    RDW 14.6 (*)    All other components within normal limits  URINALYSIS, COMPLETE (UACMP) WITH MICROSCOPIC - Abnormal; Notable for the following components:   Color, Urine AMBER (*)    APPearance CLOUDY (*)    Hgb urine dipstick MODERATE (*)    Protein, ur 100 (*)    Leukocytes, UA MODERATE (*)    All other components within normal limits  TROPONIN I - Abnormal; Notable for the following components:   Troponin I 0.03 (*)    All other  components within normal limits  GLUCOSE, CAPILLARY - Abnormal; Notable for the following components:   Glucose-Capillary 197 (*)    All other components within normal limits  MRSA PCR SCREENING  CULTURE, BLOOD (ROUTINE X 2)  CULTURE, BLOOD (ROUTINE X 2)  CULTURE, EXPECTORATED SPUTUM-ASSESSMENT  GRAM STAIN  INFLUENZA PANEL BY PCR (TYPE A & B)  BASIC METABOLIC PANEL  CBC  STREP PNEUMONIAE URINARY ANTIGEN  CBG MONITORING, ED   ____________________________________________  EKG  ED ECG REPORT I, Rudene Re, the attending physician, personally viewed and interpreted this ECG.  Normal sinus rhythm, rate of 94, LAFB, normal intervals, right axis deviation, no ST elevations or depressions. Unchanged from prior from December 2018 ____________________________________________  RADIOLOGY  Interpreted by me: CXR: unchanged reticular opacities bilaterally   Interpretation by Radiologist:  Dg Chest 2 View  Result Date: 08/07/2017 CLINICAL DATA:  Weakness and cough. EXAM: CHEST  2 VIEW COMPARISON:  Chest x-ray dated June 13, 2017. FINDINGS: The heart size and mediastinal contours are within normal limits. Low lung volumes. Reticular opacities in the right upper lobe and throughout the left lung are overall similar to prior study. No new focal airspace disease. No pleural effusion. Unchanged elevation of the right hemidiaphragm. IMPRESSION: Unchanged bilateral reticular interstitial opacities. No new focal airspace disease. Electronically Signed   By: Titus Dubin M.D.   On: 08/07/2017 16:12    ____________________________________________   PROCEDURES  Procedure(s) performed: None Procedures Critical Care performed:  Yes  CRITICAL CARE Performed by: Rudene Re  ?  Total critical care time: 40 min  Critical care time was exclusive of separately billable procedures and treating other patients.  Critical care was necessary to treat or prevent imminent or  life-threatening deterioration.  Critical care was time spent personally by me on the following activities: development of treatment plan with patient and/or surrogate as well as nursing, discussions with consultants, evaluation of patient's response to treatment, examination of patient, obtaining history from patient or surrogate, ordering and performing treatments and interventions, ordering and review of laboratory studies, ordering and review of radiographic studies, pulse oximetry and re-evaluation of patient's condition.  ____________________________________________   INITIAL IMPRESSION / ASSESSMENT AND PLAN / ED COURSE  84 y.o. male with a history of dementia,  diabetes, hypertension, hyperlipidemia, atrial fibrillation who presents for evaluation of productive cough, shortness of breath, and generalized weakness. Patient with new oxygen requirement, increased work of breathing, crackles on the left and productive cough. Presentation concerning for pneumonia vs flu. Labs and CXR pending.   ED COURSE: Patient found to be flu negative. Presentation concerning for HCAP with hypoxic respiratory failure new oxygen requirement. Patient started on cefepime, vancomycin, and doxy and admitted to the Hospitalist service.   As part of my medical decision making, I reviewed the following data within the [place] notes reviewed and incorporated, Labs reviewed , EKG interpreted , Radiograph reviewed , Discussed with admitting physician , Notes from prior ED visits and [place] Controlled Substance Database    Pertinent labs & imaging results that were available during my care of the patient were reviewed by me and considered in my medical decision making (see chart for details).    ____________________________________________   FINAL CLINICAL IMPRESSION(S) / ED DIAGNOSES  Final diagnoses:  Acute respiratory failure with hypoxia ([place])  Healthcare-associated pneumonia       NEW MEDICATIONS STARTED DURING THIS VISIT:  ED Discharge Orders    None       Note:  This document was prepared using Dragon voice recognition software and may include unintentional dictation errors.    Alfred Levins, [place], MD 08/07/17 2352

## 2017-08-07 NOTE — H&P (Signed)
[facility] NAME: Tyler Kirk    MR#:  [id number]  DATE OF BIRTH:  Sep 30, 1919  DATE OF ADMISSION:  08/07/2017  PRIMARY CARE PHYSICIAN: Jerrol Banana., MD   REQUESTING/REFERRING PHYSICIAN: Rudene Re, MD  CHIEF COMPLAINT:   Chief Complaint  Patient presents with  . Weakness   Weakness and hot. HISTORY OF PRESENT ILLNESS:  Tyler Kirk  is a 84 y.o. male with a known history of multiple medical problems as below.  Patient was sent to ED from nursing home due to above chief complaints.  The patient is demented and confused.  Per ED physician, the patient was sent here due to generalized weakness.  According to his daughters, the patient's body is very hot and has cough for the past 2 weeks.  The patient also has worsening shortness of breath.  ED physician requested admission for pneumonia and gave antibiotics.  PAST MEDICAL HISTORY:   Past Medical History:  Diagnosis Date  . Afib ([place])   . Arthritis   . BPH (benign prostatic hypertrophy)   . Cataracts, bilateral   . Depression   . Diabetes mellitus without complication ([place])   . GERD (gastroesophageal reflux disease)   . Hernia, inguinal    Chronic Large Inguinal Hernia  . Hyperlipidemia   . Hypertension   . Prostate cancer ([place])   . Pulmonary embolism ([place])   . UTI (urinary tract infection)     PAST SURGICAL HISTORY:   Past Surgical History:  Procedure Laterality Date  . bladder diverticulum removed  1960  . EYE SURGERY     catarct  . PROSTATE SURGERY     prostate removed  . skin cancer: Removed  Left    Left arm near wrist area  . TOOTH EXTRACTION      SOCIAL HISTORY:   Social History   Tobacco Use  . Smoking status: Never Smoker  . Smokeless tobacco: Never Used  Substance Use Topics  . Alcohol use: No    FAMILY HISTORY:   Family History  Problem Relation Age of Onset  . Stroke Mother   . Hypertension Mother   . Stroke  Father   . Prostate cancer Neg Hx   . Kidney cancer Neg Hx   . Bladder Cancer Neg Hx     DRUG ALLERGIES:   Allergies  Allergen Reactions  . Celexa [Citalopram Hydrobromide]     diarrhea    REVIEW OF SYSTEMS:   Review of Systems  Unable to perform ROS: Dementia    MEDICATIONS AT HOME:   Prior to Admission medications   Medication Sig Start Date End Date Taking? Authorizing Provider  acetaminophen (TYLENOL) 325 MG tablet Take 2 tablets (650 mg total) by mouth every 6 (six) hours as needed for mild pain (or Fever >/= 101). 06/15/17  Yes Bettey Costa, MD  aspirin 81 MG tablet Take 1 tablet (81 mg total) by mouth daily. 04/16/17  Yes Plonk, Gwyndolyn Saxon, MD  bifidobacterium infantis (ALIGN) capsule Take 1 capsule by mouth daily.   Yes [provider]  carvedilol (COREG) 3.125 MG tablet Take 3.125 mg by mouth 2 (two) times daily with a meal.   Yes [provider]  docusate sodium (COLACE) 250 MG capsule Take 250 mg by mouth daily as needed.   Yes [provider]  fluticasone (FLONASE) 50 MCG/ACT nasal spray Place 2 sprays into both nostrils daily as needed.    Yes [provider]  glucose blood (ONE TOUCH ULTRA TEST) test strip 1 each by Other route every morning. Use as instructed Dx:E11.9 12/09/16  Yes Jerrol Banana., MD  HYDROcodone-acetaminophen (NORCO/VICODIN) 5-325 MG tablet Take 0.5 tablets by mouth every 6 (six) hours as needed for moderate pain. 06/15/17  Yes Mody, Sital, MD  ipratropium-albuterol (DUONEB) 0.5-2.5 (3) MG/3ML SOLN Take 3 mLs by nebulization every 4 (four) hours as needed.   Yes [provider]  lisinopril (PRINIVIL,ZESTRIL) 2.5 MG tablet Take 2.5 mg by mouth daily.   Yes [provider]  mometasone (ELOCON) 0.1 % cream Apply 1 application topically 2 (two) times daily as needed. 04/16/17  Yes Plonk, William, MD  Multiple Vitamin tablet Take 1 tablet by mouth daily. 08/01/10  Yes [provider]    sitaGLIPtin (JANUVIA) 50 MG tablet Take 50 mg by mouth daily.   Yes [provider]  tamsulosin (FLOMAX) 0.4 MG CAPS capsule Take 1 capsule by mouth daily.    Yes [provider]  metoprolol tartrate (LOPRESSOR) 25 MG tablet Take 0.5 tablets (12.5 mg total) by mouth 2 (two) times daily. Patient not taking: Reported on 08/07/2017 06/15/17 112/03/2018  Bettey Costa, MD      VITAL SIGNS:  Blood pressure 115/71, pulse 92, temperature 98 F (36.7 C), temperature source Oral, resp. rate (!) 31, height 5\' 9"  (1.753 m), weight 142 lb (64.4 kg), SpO2 96 %.  PHYSICAL EXAMINATION:  Physical Exam  GENERAL:  84 y.o.-year-old patient lying in the bed with no acute distress.  EYES: Pupils equal, round, reactive to light and accommodation. No scleral icterus. Extraocular muscles intact.  HEENT: Head atraumatic, normocephalic. Oropharynx and nasopharynx clear.  NECK:  Supple, no jugular venous distention. No thyroid enlargement, no tenderness.  LUNGS: Normal breath sounds bilaterally, no wheezing, bilateral crackles. No use of accessory muscles of respiration.  CARDIOVASCULAR: S1, S2 normal. No murmurs, rubs, or gallops.  ABDOMEN: Soft, nontender, nondistended. Bowel sounds present. No organomegaly or mass.  EXTREMITIES: No pedal edema, cyanosis, or clubbing.  NEUROLOGIC: Unable to exam. PSYCHIATRIC: The patient is confused and demented SKIN: No obvious rash, lesion, or ulcer.  Some bruises on bilateral legs.  LABORATORY PANEL:   CBC Recent Labs  Lab 08/07/17 1551  WBC 10.8*  HGB 14.1  HCT 44.0  PLT 266   ------------------------------------------------------------------------------------------------------------------  Chemistries  Recent Labs  Lab 08/07/17 1551  NA 141  K 4.2  CL 103  CO2 30  GLUCOSE 232*  BUN 36*  CREATININE 0.84  CALCIUM 9.2    ------------------------------------------------------------------------------------------------------------------  Cardiac Enzymes Recent Labs  Lab 08/07/17 1551  TROPONINI 0.03*   ------------------------------------------------------------------------------------------------------------------  RADIOLOGY:  Dg Chest 2 View  Result Date: 08/07/2017 CLINICAL DATA:  Weakness and cough. EXAM: CHEST  2 VIEW COMPARISON:  Chest x-ray dated June 13, 2017. FINDINGS: The heart size and mediastinal contours are within normal limits. Low lung volumes. Reticular opacities in the right upper lobe and throughout the left lung are overall similar to prior study. No new focal airspace disease. No pleural effusion. Unchanged elevation of the right hemidiaphragm. IMPRESSION: Unchanged bilateral reticular interstitial opacities. No new focal airspace disease. Electronically Signed   By: Titus Dubin M.D.   On: 08/07/2017 16:12      IMPRESSION AND PLAN:  HAP. The patient will be admitted to medical floor. Continue cefepime and vancomycin pharmacy to dose, follow-up CBC and cultures.  Chronic respiratory failure, continue home oxygen and nebulizer as needed.  Dehydration.  Give normal saline IV in  the follow-up BMP.  COPD.  Stable. Diabetes.  Start sliding scale.  Hold p.o. diabetes medication. Hypertension.  Controlled.  All the records are reviewed and case discussed with ED provider. Management plans discussed with the patient's 2 daughters and they are in agreement.  CODE STATUS: DNR  TOTAL TIME TAKING CARE OF THIS PATIENT: 57 minutes.    Demetrios Loll M.D on 08/07/2017 at 7:22 PM  Between 7am to 6pm - Pager - [phone]  After 6pm go to [email or web address] - password [email or web address] [facility]  [facility]  Office  [phone]  CC: Primary care physician; Jerrol Banana., MD   Note: This dictation was prepared with Dragon dictation along with smaller phrase  technology. Any transcriptional errors that result from this process are unin

## 2017-08-07 NOTE — ED Triage Notes (Signed)
Pt to ED via [facility] from [facility] care. EMS reports that pt has had general decline in health over the past 2 months falling fall and admission to facility. Over the past week pt has had decreased PO intake and increased weakness. VSS with EMS, CBG 389. Pt in NAD on arrival to ED.

## 2017-08-07 NOTE — ED Notes (Signed)
Attempted in and out cath. Unable to pass catheter

## 2017-08-08 ENCOUNTER — Encounter: Payer: Self-pay | Admitting: Internal Medicine

## 2017-08-08 DIAGNOSIS — L8990 Pressure ulcer of unspecified site, unspecified stage: Secondary | ICD-10-CM

## 2017-08-08 DIAGNOSIS — E43 Unspecified severe protein-calorie malnutrition: Secondary | ICD-10-CM

## 2017-08-08 LAB — BASIC METABOLIC PANEL
ANION GAP: 9 (ref 5–15)
BUN: 40 mg/dL — ABNORMAL HIGH (ref 6–20)
CHLORIDE: 106 mmol/L (ref 101–111)
CO2: 29 mmol/L (ref 22–32)
Calcium: 9.2 mg/dL (ref 8.9–10.3)
Creatinine, Ser: 0.65 mg/dL (ref 0.61–1.24)
GFR calc non Af Amer: >60 mL/min (ref >60)
Glucose, Bld: 176 mg/dL — ABNORMAL HIGH (ref 65–99)
POTASSIUM: 3.7 mmol/L (ref 3.5–5.1)
SODIUM: 144 mmol/L (ref 135–145)

## 2017-08-08 LAB — GLUCOSE, CAPILLARY
GLUCOSE-CAPILLARY: 148 mg/dL — AB (ref 65–99)
Glucose-Capillary: 147 mg/dL — ABNORMAL HIGH (ref 65–99)
Glucose-Capillary: 131 mg/dL — ABNORMAL HIGH (ref 65–99)
Glucose-Capillary: 162 mg/dL — ABNORMAL HIGH (ref 65–99)

## 2017-08-08 LAB — MRSA PCR SCREENING: MRSA by PCR: POSITIVE — AB

## 2017-08-08 LAB — CBC
HEMATOCRIT: 41.4 % (ref 40.0–52.0)
HEMOGLOBIN: 13.5 g/dL (ref 13.0–18.0)
MCH: 28.3 pg (ref 26.0–34.0)
MCHC: 32.6 g/dL (ref 32.0–36.0)
MCV: 86.8 fL (ref 80.0–100.0)
PLATELETS: 253 10*3/uL (ref 150–440)
RBC: 4.77 MIL/uL (ref 4.40–5.90)
RDW: 14.3 % (ref 11.5–14.5)
WBC: 9.2 10*3/uL (ref 3.8–10.6)

## 2017-08-08 LAB — EXPECTORATED SPUTUM ASSESSMENT W GRAM STAIN, RFLX TO RESP C

## 2017-08-08 LAB — STREP PNEUMONIAE URINARY ANTIGEN: Strep Pneumo Urinary Antigen: NEGATIVE

## 2017-08-08 LAB — EXPECTORATED SPUTUM ASSESSMENT W REFEX TO RESP CULTURE

## 2017-08-08 MED ORDER — CHLORHEXIDINE GLUCONATE CLOTH 2 % EX PADS
6.0000 | MEDICATED_PAD | Freq: Every day | CUTANEOUS | Status: DC
  Administered 2017-08-08 – 2017-08-11 (×4): 6 via TOPICAL

## 2017-08-08 MED ORDER — BUDESONIDE 0.5 MG/2ML IN SUSP
0.5000 mg | Freq: Two times a day (BID) | RESPIRATORY_TRACT | Status: DC
  Administered 2017-08-08 – 2017-08-11 (×6): 0.5 mg via RESPIRATORY_TRACT
  Filled 2017-08-08 (×6): qty 2

## 2017-08-08 MED ORDER — METHYLPREDNISOLONE SODIUM SUCC 40 MG IJ SOLR
40.0000 mg | Freq: Every day | INTRAMUSCULAR | Status: DC
  Administered 2017-08-08 – 2017-08-10 (×3): 40 mg via INTRAVENOUS
  Filled 2017-08-08 (×3): qty 1

## 2017-08-08 MED ORDER — ALBUTEROL SULFATE (2.5 MG/3ML) 0.083% IN NEBU
2.5000 mg | INHALATION_SOLUTION | Freq: Four times a day (QID) | RESPIRATORY_TRACT | Status: DC
  Administered 2017-08-08 – 2017-08-09 (×3): 2.5 mg via RESPIRATORY_TRACT
  Filled 2017-08-08 (×4): qty 3

## 2017-08-08 MED ORDER — ADULT MULTIVITAMIN W/MINERALS CH
1.0000 | ORAL_TABLET | Freq: Every day | ORAL | Status: DC
  Administered 2017-08-09 – 2017-08-11 (×3): 1 via ORAL
  Filled 2017-08-08 (×3): qty 1

## 2017-08-08 MED ORDER — ENSURE ENLIVE PO LIQD
237.0000 mL | Freq: Three times a day (TID) | ORAL | Status: DC
  Administered 2017-08-08 – 2017-08-11 (×6): 237 mL via ORAL

## 2017-08-08 MED ORDER — CEFEPIME HCL 2 G IJ SOLR
2.0000 g | Freq: Two times a day (BID) | INTRAMUSCULAR | Status: DC
  Administered 2017-08-08 – 2017-08-10 (×6): 2 g via INTRAVENOUS
  Filled 2017-08-08 (×8): qty 2

## 2017-08-08 MED ORDER — MUPIROCIN 2 % EX OINT
1.0000 "application " | TOPICAL_OINTMENT | Freq: Two times a day (BID) | CUTANEOUS | Status: DC
  Administered 2017-08-08 – 2017-08-11 (×8): 1 via NASAL
  Filled 2017-08-08 (×2): qty 22

## 2017-08-08 MED ORDER — FUROSEMIDE 10 MG/ML IJ SOLN
40.0000 mg | Freq: Once | INTRAMUSCULAR | Status: AC
  Administered 2017-08-08: 40 mg via INTRAVENOUS
  Filled 2017-08-08: qty 4

## 2017-08-08 NOTE — NC FL2 (Signed)
[place] LEVEL OF CARE SCREENING TOOL     IDENTIFICATION  Patient Name: Tyler Kirk Birthdate: 06/06/1920 Sex: male Admission Date (Current Location): 08/07/2017  [place] and [place] Number:  Engineering geologist and Address:  [facility], [address], [place], [place] [address]      Provider Number: [id number]  Attending Physician Name and Address:  Loletha Grayer, MD  Relative Name and Phone Number:       Current Level of Care: Hospital Recommended Level of Care: [place] Prior Approval Number:    Date Approved/Denied:   PASRR Number: [id number] )  Discharge Plan: SNF    Current Diagnoses: Patient Active Problem List   Diagnosis Date Noted  . Pneumonia 08/07/2017  . Syncope 06/13/2017  . Elevated PSA 04/16/2017  . Urinary incontinence 02/14/2017  . Venous stasis of both lower extremities 02/14/2017  . Allergic rhinitis 02/14/2017  . Eczema 02/14/2017  . Gait instability 02/13/2017  . Microscopic hematuria 11/19/2016  . Arthritis 12/01/2014  . A-fib ([place]) 12/01/2014  . Benign fibroma of prostate 12/01/2014  . Cataract 12/01/2014  . Clinical depression 12/01/2014  . Esophagitis, reflux 12/01/2014  . Fatigue 12/01/2014  . Bilateral hearing loss 12/01/2014  . Hyperplasia, prostate 12/01/2014  . Mild cognitive impairment 12/01/2014  . Hx pulmonary embolism 12/01/2014  . Basal cell papilloma 12/01/2014  . Hernia, inguinal, unilateral 05/09/2006  . HLD (hyperlipidemia) 03/28/2006  . Diabetes mellitus, type 2 ([place]) 03/28/2006    Orientation RESPIRATION BLADDER Height & Weight     Self, Time  O2(3 Liters Oxygen. ) Incontinent Weight: 141 lb 12.8 oz (64.3 kg) Height:  5\' 8"  (172.7 cm)  BEHAVIORAL SYMPTOMS/MOOD NEUROLOGICAL BOWEL NUTRITION STATUS      Continent Diet(Diet: Regular )  AMBULATORY STATUS COMMUNICATION OF NEEDS Skin   Extensive Assist Verbally PU Stage and Appropriate  Care(pressure ulcer stage 2 on buttocks. )                       Personal Care Assistance Level of Assistance  Bathing, Feeding, Dressing Bathing Assistance: Limited assistance Feeding assistance: Independent Dressing Assistance: Limited assistance     Functional Limitations Info  Sight, Hearing, Speech Sight Info: Adequate Hearing Info: Impaired Speech Info: Adequate    SPECIAL CARE FACTORS FREQUENCY   [place] [place] [facility] to follow                     Contractures      Additional Factors Info  Code Status, Allergies, Isolation Precautions Code Status Info: (DNR ) Allergies Info: (Celexa Citalopram Hydrobromide)     Isolation Precautions Info: (MRSA Nasal Swab. )     Current Medications (08/08/2017):  This is the current hospital active medication list Current Facility-Administered Medications  Medication Dose Route Frequency Provider Last Rate Last Dose  . acetaminophen (TYLENOL) tablet 650 mg  650 mg Oral Q6H PRN Demetrios Loll, MD   650 mg at 08/07/17 2227   Or  . acetaminophen (TYLENOL) suppository 650 mg  650 mg Rectal Q6H PRN Demetrios Loll, MD      . acidophilus (RISAQUAD) capsule 1 capsule  1 capsule Oral Daily Demetrios Loll, MD   1 capsule at 08/08/17 [phone]  . albuterol (PROVENTIL) (2.5 MG/3ML) 0.083% nebulizer solution 2.5 mg  2.5 mg Nebulization Q6H Wieting, Richard, MD   2.5 mg at 08/08/17 1409  . aspirin EC tablet 81 mg  81 mg Oral Daily Demetrios Loll, MD  81 mg at 08/08/17 0837  . bisacodyl (DULCOLAX) EC tablet 5 mg  5 mg Oral Daily PRN Demetrios Loll, MD      . budesonide (PULMICORT) nebulizer solution 0.5 mg  0.5 mg Nebulization BID Wieting, Richard, MD      . carvedilol (COREG) tablet 3.125 mg  3.125 mg Oral BID WC Demetrios Loll, MD   3.125 mg at 08/08/17 0837  . ceFEPIme (MAXIPIME) 2 g in dextrose 5 % 50 mL IVPB  2 g Intravenous Q12H Lenis Noon, [facility]   Stopped at 08/08/17 1343  . Chlorhexidine Gluconate Cloth 2 % PADS 6 each  6 each Topical Q0600 Loletha Grayer, MD   6 each at 08/08/17 0600  . docusate sodium (COLACE) capsule 200 mg  200 mg Oral BID Demetrios Loll, MD   200 mg at 08/08/17 7001  . enoxaparin (LOVENOX) injection 40 mg  40 mg Subcutaneous Q24H Demetrios Loll, MD      . feeding supplement (ENSURE ENLIVE) (ENSURE ENLIVE) liquid 237 mL  237 mL Oral TID BM Wieting, Richard, MD      . HYDROcodone-acetaminophen (NORCO/VICODIN) 5-325 MG per tablet 1-2 tablet  1-2 tablet Oral Q4H PRN Demetrios Loll, MD      . insulin aspart (novoLOG) injection 0-5 Units  0-5 Units Subcutaneous QHS Demetrios Loll, MD      . insulin aspart (novoLOG) injection 0-9 Units  0-9 Units Subcutaneous TID WC Demetrios Loll, MD   1 Units at 08/08/17 1313  . lisinopril (PRINIVIL,ZESTRIL) tablet 2.5 mg  2.5 mg Oral Daily Demetrios Loll, MD   2.5 mg at 08/08/17 0837  . methylPREDNISolone sodium succinate (SOLU-MEDROL) 40 mg/mL injection 40 mg  40 mg Intravenous Daily Loletha Grayer, MD   40 mg at 08/08/17 1400  . [START ON 08/09/2017] multivitamin with minerals tablet 1 tablet  1 tablet Oral Daily Wieting, Richard, MD      . mupirocin ointment (BACTROBAN) 2 % 1 application  1 application Nasal BID Loletha Grayer, MD   1 application at 74/94/49 [phone]  . ondansetron (ZOFRAN) tablet 4 mg  4 mg Oral Q6H PRN Demetrios Loll, MD       Or  . ondansetron [facility]) injection 4 mg  4 mg Intravenous Q6H PRN Demetrios Loll, MD      . senna-docusate (Senokot-S) tablet 1 tablet  1 tablet Oral QHS PRN Demetrios Loll, MD      . tamsulosin [facility]) capsule 0.4 mg  0.4 mg Oral Daily Demetrios Loll, MD   0.4 mg at 08/08/17 1638  . vancomycin (VANCOCIN) 1,250 mg in sodium chloride 0.9 % 250 mL IVPB  1,250 mg Intravenous Q24H Coffee, Donna Christen [address] [facility]   Stopped at 08/08/17 4665     Discharge Medications: Please see discharge summary for a list of discharge medications.  Relevant Imaging Results:  Relevant Lab Results:   Additional Information (SSN: [id number])  Davyn Morandi, Veronia Beets, LCSW

## 2017-08-08 NOTE — Progress Notes (Signed)
Pharmacy Antibiotic Note  Tyler Kirk is a 84 y.o. male admitted on 08/07/2017 with pneumonia.  Pharmacy has been consulted for cefepime + vancomycin dosing.  Plan: Ordered cefepime 2 g IV q12h  Continue vancomycin 1250 mg IV 24h Goal VT 15-20 mcg/mL  Height: 5\' 8"  (172.7 cm) Weight: 141 lb 12.8 oz (64.3 kg) IBW/kg (Calculated) : 68.4  Temp (24hrs), Avg:97.8 F (36.6 C), Min:97.7 F (36.5 C), Max:98 F (36.7 C)  Recent Labs  Lab 08/07/17 1551 08/08/17 0512  WBC 10.8* 9.2  CREATININE 0.84 0.65    Estimated Creatinine Clearance: 50.2 mL/min (by C-G formula based on SCr of 0.65 mg/dL).    Allergies  Allergen Reactions  . Celexa [Citalopram Hydrobromide]     diarrhea    Antimicrobials this admission: Cefepime 2/8 > Vancomycin 2/7 >  Micro: 2/7 Blood: No growth < 12 hours 2/7 Urine: Sent 2/8 Sputum: Pending  Thank you for allowing pharmacy to be a part of this patient's care.  Lenis Noon, PharmD, BCPS Clinical Pharmacist 08/08/2017 1:52 PM

## 2017-08-08 NOTE — Progress Notes (Signed)
New referral for [facility] services at [facility] received from Bonnie. Patient is a 84 year old man with a known history of Afib, arthritis, BPH, DM II, GERD, HLD, HTN, Depression and Prostate cancer admitted to [facility] on 2/7 treatment of pneumonia. Per chart note review and discussion with patient's daughter's he has had a continued decline in function, has has also lost 23 lbs since December and continues with a poor appetite. Writer met in the room with patient's daughter Leda Gauze and then spoke on the phone with patient's daughter and HCPOA Melina Schools to initiate education regarding hospice services, philosophy and team approach to care with understanding voiced. Patient was alert and awake, no s/s of increased work of breathing or pain. He required increased oxygen earlier in the day, now on at 2 liters. Daughters have addressed concerns with patient's return to [facility] with hospital CSW [place]. No discharge date at this time. Hospice information and contact numbers left with Leda Gauze in the room, [name] is aware. Patient information faxed to referral. Please contact hospice at [phone] if patient discharges over the weekend. Thank you. Flo Shanks RN, BSN, [place] [facility], hospital Liaison [phone]

## 2017-08-08 NOTE — Progress Notes (Signed)
Initial Nutrition Assessment  DOCUMENTATION CODES:   Severe malnutrition in context of chronic illness  INTERVENTION:  Discussed with MD. Faythe Ghee to liberalize to regular diet.  Provide Ensure Enlive po TID, each supplement provides 350 kcal and 20 grams of protein.  Provide Magic cup TID with meals, each supplement provides 290 kcal and 9 grams of protein.  Provide MVI daily.  NUTRITION DIAGNOSIS:   Severe Malnutrition related to chronic illness(COPD, hx prostate cancer, advanced age, depression) as evidenced by severe fat depletion, moderate muscle depletion, severe muscle depletion, 17.3 percent weight loss over 3 months.  GOAL:   Patient will meet greater than or equal to 90% of their needs  MONITOR:   PO intake, Supplement acceptance, Labs, Weight trends, Skin, I & O's  REASON FOR ASSESSMENT:   Malnutrition Screening Tool    ASSESSMENT:   84 year old male with PMHx of depression, DM type 2, HTN, HLD, pulmonary embolism, A-fib, GERD, COPD, hx inguinal hernia, BPH, prostate cancer s/p prostatectomy who presented from SNF with weakness, cough, SOB and admitted with chronic respiratory failure, dehydration.   Met with patient at bedside. He was sleeping. He did wake up to name call. Patient did not speak but was able to nod his head some and hold up fingers. He nodded yes he has a poor appetite. When asked for how long he simply held up 2 fingers. RD tried to clarify time period, but patient waved RD away. No family members present to provide history.  Per chart patient's UBW looks to be around 170 lbs. He was 171.4 lbs 05/13/2017. He has lost 29.6 lbs (17.3% body weight) over 3 months, which is significant for time frame.  Medications reviewed and include: acidophilus 1 capsule daily, Colace, Novolog 0-9 units TID, Novolog 0-5 units QHS, Solu-Medrol 40 mg daily IV, Flomax, cefepime, vancomycin.  Labs reviewed: CBG 131-197, BUN 40.  NUTRITION - FOCUSED PHYSICAL EXAM:   Most Recent Value  Orbital Region  Severe depletion  Upper Arm Region  Severe depletion  Thoracic and Lumbar Region  Severe depletion  Buccal Region  Severe depletion  Temple Region  Severe depletion  Clavicle Bone Region  Moderate depletion  Clavicle and Acromion Bone Region  Moderate depletion  Scapular Bone Region  Moderate depletion  Dorsal Hand  Moderate depletion  Patellar Region  Severe depletion  Anterior Thigh Region  Severe depletion  Posterior Calf Region  Severe depletion  Edema (RD Assessment)  None  Hair  Reviewed  Eyes  Unable to assess  Mouth  Unable to assess  Skin  Reviewed  Nails  Reviewed     Diet Order:  Diet heart healthy/carb modified Room service appropriate? Yes with Assist; Fluid consistency: Thin  EDUCATION NEEDS:   Not appropriate for education at this time  Skin:  Skin Assessment: Skin Integrity Issues: Skin Integrity Issues:: Stage II Stage II: buttocks x2  Last BM:  Unknown  Height:   Ht Readings from Last 1 Encounters:  08/07/17 5' 8"  (1.727 m)    Weight:   Wt Readings from Last 1 Encounters:  08/07/17 141 lb 12.8 oz (64.3 kg)    Ideal Body Weight:  70 kg  BMI:  Body mass index is 21.56 kg/m.  Estimated Nutritional Needs:   Kcal:  1510-1760 (MSJ x 1.2-1.4)  Protein:  75-90 grams (1.2-1.4 grams/kg)  Fluid:  1.6 L/day (25 mL/kg)  Willey Blade, MS, RD, LDN Office: [phone] Pager: [phone] After Hours/Weekend Pager: [phone]

## 2017-08-08 NOTE — Progress Notes (Signed)
Patient ID: Tyler Kirk, male   DOB: October 20, 1919, 84 y.o.   MRN: [id number]  [employer] PROGRESS NOTE  Tyler Kirk AOZ:[id number] DOB: 1919-10-16 DOA: 08/07/2017 PCP: Tyler Banana., MD  HPI/Subjective: Patient using accessory muscles to breathe.  Answers very few questions.  Some cough.  Some shortness of breath.  Objective: Vitals:   08/08/17 0815 08/08/17 1453  BP: (!) 144/75 (!) 152/83  Pulse: 89 91  Resp: (!) 22 20  Temp:  (!) 97.5 F (36.4 C)  SpO2: 98% 91%    Filed Weights   08/07/17 1547 08/07/17 2021  Weight: 64.4 kg (142 lb) 64.3 kg (141 lb 12.8 oz)    ROS: Review of Systems  Unable to perform ROS: Acuity of condition  Respiratory: Positive for cough and shortness of breath.   Cardiovascular: Negative for chest pain.  Gastrointestinal: Negative for abdominal pain.   Exam: Physical Exam  HENT:  Nose: No mucosal edema.  Mouth/Throat: No oropharyngeal exudate or posterior oropharyngeal edema.  Eyes: Conjunctivae, EOM and lids are normal. Pupils are equal, round, and reactive to light.  Neck: No JVD present. Carotid bruit is not present. No edema present. No thyroid mass and no thyromegaly present.  Cardiovascular: S1 normal and S2 normal. Exam reveals no gallop.  No murmur heard. Pulses:      Dorsalis pedis pulses are 2+ on the right side, and 2+ on the left side.  Respiratory: He is in respiratory distress. He has decreased breath sounds in the right middle field, the right lower field, the left middle field and the left lower field. He has no wheezes. He has rhonchi in the right middle field, the right lower field, the left middle field and the left lower field. He has no rales.  GI: Soft. Bowel sounds are normal. There is no tenderness.  Musculoskeletal:       Right ankle: He exhibits swelling.       Left ankle: He exhibits swelling.  Lymphadenopathy:    He has no cervical adenopathy.  Neurological: He is alert.  Skin: Skin is warm.  Nails show no clubbing.  Stage II decubitus ulcer on buttock.  Chronic lower extremity discoloration with brownish discoloration bilaterally  Psychiatric: He has a normal mood and affect.      Data Reviewed: Basic Metabolic Panel: Recent Labs  Lab 08/07/17 1551 08/08/17 0512  NA 141 144  K 4.2 3.7  CL 103 106  CO2 30 29  GLUCOSE 232* 176*  BUN 36* 40*  CREATININE 0.84 0.65  CALCIUM 9.2 9.2   CBC: Recent Labs  Lab 08/07/17 1551 08/08/17 0512  WBC 10.8* 9.2  HGB 14.1 13.5  HCT 44.0 41.4  MCV 87.1 86.8  PLT 266 253   Cardiac Enzymes: Recent Labs  Lab 08/07/17 1551  TROPONINI 0.03*   BNP (last 3 results) Recent Labs    06/12/17 1657 06/13/17 1215  BNP 49 35.0    CBG: Recent Labs  Lab 08/07/17 2128 08/08/17 0741 08/08/17 1206  GLUCAP 197* 147* 131*    Recent Results (from the past 240 hour(s))  Culture, blood (routine x 2) Call MD if unable to obtain prior to antibiotics being given     Status: None (Preliminary result)   Collection Time: 08/07/17  8:34 PM  Result Value Ref Range Status   Specimen Description BLOOD LAC  Final   Special Requests   Final    BOTTLES DRAWN AEROBIC AND ANAEROBIC Blood Culture adequate volume  Culture   Final    NO GROWTH < 24 HOURS Performed at [facility], [address] [place], [place] [address]    Report Status PENDING  Incomplete  Culture, blood (routine x 2) Call MD if unable to obtain prior to antibiotics being given     Status: None (Preliminary result)   Collection Time: 08/07/17  8:39 PM  Result Value Ref Range Status   Specimen Description BLOOD RAC  Final   Special Requests   Final    BOTTLES DRAWN AEROBIC AND ANAEROBIC Blood Culture adequate volume   Culture   Final    NO GROWTH < 24 HOURS Performed at [facility], [address] [place], [place] [address]    Report Status PENDING  Incomplete  MRSA PCR Screening     Status: Abnormal   Collection Time: 08/07/17 10:26 PM   Result Value Ref Range Status   MRSA by PCR POSITIVE (A) NEGATIVE Final    Comment:        The GeneXpert MRSA Assay (FDA approved for NASAL specimens only), is one component of a comprehensive MRSA colonization surveillance program. It is not intended to diagnose MRSA infection nor to guide or monitor treatment for MRSA infections. RESULT CALLED TO, READ BACK BY AND VERIFIED WITH: Tyler Kirk ON 08/08/17 AT 0103 JAG Performed at [facility], [address] [place], [place] [address]   Culture, sputum-assessment     Status: None   Collection Time: 08/08/17  2:03 AM  Result Value Ref Range Status   Specimen Description EXPECTORATED SPUTUM  Final   Special Requests NONE  Final   Sputum evaluation   Final    THIS SPECIMEN IS ACCEPTABLE FOR SPUTUM CULTURE Performed at [facility], [address] [place], [place] [address]    Report Status 08/08/2017 FINAL  Final  Culture, respiratory (NON-Expectorated)     Status: None (Preliminary result)   Collection Time: 08/08/17  2:03 AM  Result Value Ref Range Status   Specimen Description   Final    EXPECTORATED SPUTUM Performed at [facility], [address], [place], [place] [address]    Special Requests   Final    NONE Reflexed from [id number] Performed at [facility], [address] [place], [place] [address]    Gram Stain   Final    NO WBC SEEN FEW BUDDING YEAST SEEN Performed at [facility], [address] [place],  [address]    Culture PENDING  Incomplete   Report Status PENDING  Incomplete     Studies: Dg Chest 2 View  Result Date: 08/07/2017 CLINICAL DATA:  Weakness and cough. EXAM: CHEST  2 VIEW COMPARISON:  Chest x-ray dated June 13, 2017. FINDINGS: The heart size and mediastinal contours are within normal limits. Low lung volumes. Reticular opacities in the right upper lobe and throughout the left lung are overall similar to prior study. No new focal airspace  disease. No pleural effusion. Unchanged elevation of the right hemidiaphragm. IMPRESSION: Unchanged bilateral reticular interstitial opacities. No new focal airspace disease. Electronically Signed   By: Titus Dubin M.D.   On: 08/07/2017 16:12    Scheduled Meds: . acidophilus  1 capsule Oral Daily  . albuterol  2.5 mg Nebulization Q6H  . aspirin EC  81 mg Oral Daily  . budesonide (PULMICORT) nebulizer solution  0.5 mg Nebulization BID  . carvedilol  3.125 mg Oral BID WC  . Chlorhexidine Gluconate Cloth  6  each Topical Q0600  . docusate sodium  200 mg Oral BID  . enoxaparin (LOVENOX) injection  40 mg Subcutaneous Q24H  . feeding supplement (ENSURE ENLIVE)  237 mL Oral TID BM  . insulin aspart  0-5 Units Subcutaneous QHS  . insulin aspart  0-9 Units Subcutaneous TID WC  . lisinopril  2.5 mg Oral Daily  . methylPREDNISolone (SOLU-MEDROL) injection  40 mg Intravenous Daily  . [START ON 08/09/2017] multivitamin with minerals  1 tablet Oral Daily  . mupirocin ointment  1 application Nasal BID  . tamsulosin  0.4 mg Oral Daily   Continuous Infusions: . ceFEPime (MAXIPIME) IV Stopped (08/08/17 1343)  . vancomycin Stopped (08/08/17 0429)    Assessment/Plan:  1. Healthcare associated pneumonia.  Patient started on cefepime and vancomycin. 2. Acute on chronic respiratory failure.  Patient is using accessory muscles to breathe.  Continue oxygen.  Nebulizer treatments made standing dose.  Give 1 dose of Lasix and stop IV fluids.  Add Solu-Medrol and budesonide nebulizers. 3. COPD exacerbation.  Start Solu-Medrol and standing dose nebulizer treatments 4. Type 2 diabetes mellitus.  On sliding scale 5. Essential hypertension this is controlled 6. Urinalysis also positive.  Urine culture ordered. 7. BPH on Flomax. 8. Severe malnutrition 9. Stage II decubitus ulcer  Code Status:     Code Status Orders  (From admission, onward)        Start     Ordered   08/07/17 2021  Do not attempt  resuscitation (DNR)  Continuous    Question Answer Comment  In the event of cardiac or respiratory ARREST Do not call a "code blue"   In the event of cardiac or respiratory ARREST Do not perform Intubation, CPR, defibrillation or ACLS   In the event of cardiac or respiratory ARREST Use medication by any route, position, wound care, and other measures to relive pain and suffering. May use oxygen, suction and manual treatment of airway obstruction as needed for comfort.      08/07/17 2020    Code Status History    Date Active Date Inactive Code Status Order ID Comments User Context   06/13/2017 20:50 06/15/2017 16:10 DNR [id number]  Demetrios Loll, MD Inpatient    Advance Directive Documentation     Most Recent Value  Type of Advance Directive  Out of facility DNR (pink MOST or yellow form)  Pre-existing out of facility DNR order (yellow form or pink MOST form)  Yellow form placed in chart (order not valid for inpatient use)  "MOST" Form in Place?  No data     Family Communication: Spoke with family at the bedside Disposition Plan: To be determined  Antibiotics:  Cefepime  And vancomycin  Time spent: 35 minutes including ACP time  [employer]

## 2017-08-08 NOTE — Progress Notes (Signed)
Patient ID: Tyler Kirk, male   DOB: [birth year], 84 y.o.   MRN: [id number]  ACP note  Patient and daughters at the bedside  Diagnosis; pneumonia, acute on chronic respiratory failure, COPD exacerbation, Severe malnutrition, stage II decubitus ulcer.  Patient is a DNR. Family interested in hospice and I think this is appropriate. Need to see how he progresses over the next couple days to see what our overall plan will be.  Time spent on ACP discussion 17 minutes  Dr. Loletha Grayer

## 2017-08-08 NOTE — Clinical Social Work Note (Signed)
Clinical Social Work Assessment  Patient Details  Name: MONTERRIO GERST MRN: [id number] Date of Birth: Mar 31, 1920  Date of referral:  08/08/17               Reason for consult:  Other (Comment Required)(From [facility] L[facility] )                Permission sought to share information with:  Chartered certified accountant granted to share information::  Yes, Verbal Permission Granted  Name::      Company secretary::   [place]   Relationship::     Contact Information:     Housing/Transportation Living arrangements for the past 2 months:  [place] of Information:  Adult Children, Facility Patient Interpreter Needed:  None Criminal Activity/Legal Involvement Pertinent to Current Situation/Hospitalization:  No - Comment as needed Significant Relationships:  Adult Children Lives with:  Facility Resident Do you feel safe going back to the place where you live?  Yes Need for family participation in patient care:  Yes (Comment)  Care giving concerns:  Patient is a long term care resident at [facility].    Social Worker assessment / plan:  Holiday representative ([place]) reviewed chart and noted that patient is from [facility]. Per [facility] admissions coordinator at [facility] patient is a long term care resident and can return when stable. CSW contacted patient's daughter [name] who reported that she is patient's HPOA. Daughter reported that patient has been at [facility] since December 2018. Per Becky patient is a long term care resident under a long term care policy and private pay. Daughter asked if patient can be moved to a different facility. CSW explained that long term care beds are limited that she will have to speak to the social worker at [facility] about that. CSW explained that patient will likely be in the hospital for a short amount of time and will have to return to  [facility]. Daughter verbalized her understanding and requested hospice through [place]. Daughter reported that she has spoken with Jackelyn Poling at [facility] and arrangements could not be made before patient came into [facility]. CSW made hospice referral to [facility]. CSW will continue to follow and assist as needed.   Employment status:  Disabled (Comment on whether or not currently receiving Disability) Insurance information:  Managed Medicare PT Recommendations:  Not assessed at this time Information / Referral to community resources:  [place]  Patient/Family's Response to care:  Patient's daughter requested hospice.   Patient/Family's Understanding of and Emotional Response to Diagnosis, Current Treatment, and Prognosis:  Patient's daughter was very pleasant and thanked CSW for assistance.   Emotional Assessment Appearance:  Appears stated age Attitude/Demeanor/Rapport:  Unable to Assess Affect (typically observed):  Unable to Assess Orientation:  Oriented to Self, Oriented to Place, Fluctuating Orientation (Suspected and/or reported Sundowners) Alcohol / Substance use:  Not Applicable Psych involvement (Current and /or in the community):  No (Comment)  Discharge Needs  Concerns to be addressed:  Discharge Planning Concerns Readmission within the last 30 days:  No Current discharge risk:  Dependent with Mobility Barriers to Discharge:  Continued Medical Work up   [employer], Veronia Beets, LCSW 08/08/2017, 3:45 PM

## 2017-08-09 LAB — GLUCOSE, CAPILLARY
GLUCOSE-CAPILLARY: 201 mg/dL — AB (ref 65–99)
GLUCOSE-CAPILLARY: 198 mg/dL — AB (ref 65–99)
Glucose-Capillary: 171 mg/dL — ABNORMAL HIGH (ref 65–99)
Glucose-Capillary: 142 mg/dL — ABNORMAL HIGH (ref 65–99)
Glucose-Capillary: 194 mg/dL — ABNORMAL HIGH (ref 65–99)

## 2017-08-09 LAB — CREATININE, SERUM
Creatinine, Ser: 0.73 mg/dL (ref 0.61–1.24)
GFR calc Af Amer: >60 mL/min (ref >60)
GFR calc non Af Amer: >60 mL/min (ref >60)

## 2017-08-09 MED ORDER — IPRATROPIUM-ALBUTEROL 0.5-2.5 (3) MG/3ML IN SOLN
3.0000 mL | Freq: Two times a day (BID) | RESPIRATORY_TRACT | Status: DC
  Administered 2017-08-10 – 2017-08-11 (×3): 3 mL via RESPIRATORY_TRACT
  Filled 2017-08-09 (×3): qty 3

## 2017-08-09 MED ORDER — ALBUTEROL SULFATE (2.5 MG/3ML) 0.083% IN NEBU
2.5000 mg | INHALATION_SOLUTION | Freq: Two times a day (BID) | RESPIRATORY_TRACT | Status: DC
  Administered 2017-08-09: 21:00:00 2.5 mg via RESPIRATORY_TRACT
  Filled 2017-08-09: qty 3

## 2017-08-09 MED ORDER — IPRATROPIUM-ALBUTEROL 0.5-2.5 (3) MG/3ML IN SOLN: 3.0000 mL | Freq: Two times a day (BID) | RESPIRATORY_TRACT | Status: DC

## 2017-08-09 MED ORDER — ALBUTEROL SULFATE (2.5 MG/3ML) 0.083% IN NEBU: 2.5000 mg | INHALATION_SOLUTION | RESPIRATORY_TRACT | Status: DC | PRN

## 2017-08-09 MED ORDER — MORPHINE SULFATE (CONCENTRATE) 10 MG/0.5ML PO SOLN
5.0000 mg | ORAL | Status: DC | PRN
  Administered 2017-08-11: 5 mg via ORAL
  Filled 2017-08-09: qty 1

## 2017-08-09 NOTE — Progress Notes (Signed)
Patient ID: Tyler Kirk, male   DOB: 12/31/1919, 84 y.o.   MRN: [id number]  [employer] PROGRESS NOTE  Tyler Kirk:[id number] DOB: 04-13-1920 DOA: 08/07/2017 PCP: Jerrol Banana., MD  HPI/Subjective: Patient is awake but sleeps easily.  Visibly short of breath.  Daughter at bedside.  Poor oral intake.  Objective: Vitals:   08/09/17 0418 08/09/17 1144  BP: (!) 155/78 (!) 146/69  Pulse: 88 76  Resp: (!) 24   Temp: 98.1 F (36.7 C)   SpO2: 98% 100%    Filed Weights   08/07/17 1547 08/07/17 2021  Weight: 64.4 kg (142 lb) 64.3 kg (141 lb 12.8 oz)    ROS: Review of Systems  Unable to perform ROS: Acuity of condition  Respiratory: Positive for cough and shortness of breath.   Cardiovascular: Negative for chest pain.  Gastrointestinal: Negative for abdominal pain.   Exam: Physical Exam  HENT:  Nose: No mucosal edema.  Mouth/Throat: No oropharyngeal exudate or posterior oropharyngeal edema.  Eyes: Conjunctivae, EOM and lids are normal. Pupils are equal, round, and reactive to light.  Neck: No JVD present. Carotid bruit is not present. No edema present. No thyroid mass and no thyromegaly present.  Cardiovascular: S1 normal and S2 normal. Exam reveals no gallop.  No murmur heard. Pulses:      Dorsalis pedis pulses are 2+ on the right side, and 2+ on the left side.  Respiratory: He is in respiratory distress. He has decreased breath sounds in the right middle field, the right lower field, the left middle field and the left lower field. He has no wheezes. He has rhonchi in the right middle field, the right lower field, the left middle field and the left lower field. He has no rales.  GI: Soft. Bowel sounds are normal. There is no tenderness.  Musculoskeletal:       Right ankle: He exhibits swelling.       Left ankle: He exhibits swelling.  Lymphadenopathy:    He has no cervical adenopathy.  Neurological: He is alert.  Skin: Skin is warm. Nails show no  clubbing.  Stage II decubitus ulcer on buttock.  Chronic lower extremity discoloration with brownish discoloration bilaterally  Psychiatric: He has a normal mood and affect.      Data Reviewed: Basic Metabolic Panel: Recent Labs  Lab 08/07/17 1551 08/08/17 0512 08/09/17 0402  NA 141 144  --   K 4.2 3.7  --   CL 103 106  --   CO2 30 29  --   GLUCOSE 232* 176*  --   BUN 36* 40*  --   CREATININE 0.84 0.65 0.73  CALCIUM 9.2 9.2  --    CBC: Recent Labs  Lab 08/07/17 1551 08/08/17 0512  WBC 10.8* 9.2  HGB 14.1 13.5  HCT 44.0 41.4  MCV 87.1 86.8  PLT 266 253   Cardiac Enzymes: Recent Labs  Lab 08/07/17 1551  TROPONINI 0.03*   BNP (last 3 results) Recent Labs    06/12/17 1657 06/13/17 1215  BNP 49 35.0    CBG: Recent Labs  Lab 08/08/17 1206 08/08/17 1702 08/08/17 2142 08/09/17 0726 08/09/17 1140  GLUCAP 131* 148* 162* 171* 142*    Recent Results (from the past 240 hour(s))  Urine Culture     Status: Abnormal (Preliminary result)   Collection Time: 08/07/17  8:19 PM  Result Value Ref Range Status   Specimen Description   Final    URINE, RANDOM Performed at  [facility], [address], [place], [place] [address]    Special Requests   Final    Normal Performed at [facility], [address] [place], [place] [address]    Culture (A)  Final    40,000 COLONIES/mL STAPHYLOCOCCUS AUREUS SUSCEPTIBILITIES TO FOLLOW Performed at [facility], [place] [address], [place], [place] [address]    Report Status PENDING  Incomplete  Culture, blood (routine x 2) Call MD if unable to obtain prior to antibiotics being given     Status: None (Preliminary result)   Collection Time: 08/07/17  8:34 PM  Result Value Ref Range Status   Specimen Description BLOOD LAC  Final   Special Requests   Final    BOTTLES DRAWN AEROBIC AND ANAEROBIC Blood Culture adequate volume   Culture   Final    NO GROWTH 2 DAYS Performed at [facility],  [address], [place], [place] [address]    Report Status PENDING  Incomplete  Culture, blood (routine x 2) Call MD if unable to obtain prior to antibiotics being given     Status: None (Preliminary result)   Collection Time: 08/07/17  8:39 PM  Result Value Ref Range Status   Specimen Description BLOOD RAC  Final   Special Requests   Final    BOTTLES DRAWN AEROBIC AND ANAEROBIC Blood Culture adequate volume   Culture   Final    NO GROWTH 2 DAYS Performed at [facility], [address], [place], [place] [address]    Report Status PENDING  Incomplete  MRSA PCR Screening     Status: Abnormal   Collection Time: 08/07/17 10:26 PM  Result Value Ref Range Status   MRSA by PCR POSITIVE (A) NEGATIVE Final    Comment:        The GeneXpert MRSA Assay (FDA approved for NASAL specimens only), is one component of a comprehensive MRSA colonization surveillance program. It is not intended to diagnose MRSA infection nor to guide or monitor treatment for MRSA infections. RESULT CALLED TO, READ BACK BY AND VERIFIED WITH: [name] ON 08/08/17 AT 0103 JAG Performed at [facility], [address] [place], [place] [address]   Culture, sputum-assessment     Status: None   Collection Time: 08/08/17  2:03 AM  Result Value Ref Range Status   Specimen Description EXPECTORATED SPUTUM  Final   Special Requests NONE  Final   Sputum evaluation   Final    THIS SPECIMEN IS ACCEPTABLE FOR SPUTUM CULTURE Performed at [facility], [address] [place], [place] [address]    Report Status 08/08/2017 FINAL  Final  Culture, respiratory (NON-Expectorated)     Status: None (Preliminary result)   Collection Time: 08/08/17  2:03 AM  Result Value Ref Range Status   Specimen Description   Final    EXPECTORATED SPUTUM Performed at [facility], [address], [place], [place] [address]    Special Requests   Final    NONE Reflexed from [id number] Performed at  [facility], [address] [place], [place] [address]    Gram Stain   Final    NO WBC SEEN FEW BUDDING YEAST SEEN Performed at [facility], [place] [address] [place],  [address]    Culture ABUNDANT YEAST  Final   Report Status PENDING  Incomplete     Studies: Dg Chest 2 View  Result Date: 08/07/2017 CLINICAL DATA:  Weakness and cough. EXAM:  CHEST  2 VIEW COMPARISON:  Chest x-ray dated June 13, 2017. FINDINGS: The heart size and mediastinal contours are within normal limits. Low lung volumes. Reticular opacities in the right upper lobe and throughout the left lung are overall similar to prior study. No new focal airspace disease. No pleural effusion. Unchanged elevation of the right hemidiaphragm. IMPRESSION: Unchanged bilateral reticular interstitial opacities. No new focal airspace disease. Electronically Signed   By: Titus Dubin M.D.   On: 08/07/2017 16:12    Scheduled Meds: . acidophilus  1 capsule Oral Daily  . albuterol  2.5 mg Nebulization BID  . aspirin EC  81 mg Oral Daily  . budesonide (PULMICORT) nebulizer solution  0.5 mg Nebulization BID  . carvedilol  3.125 mg Oral BID WC  . Chlorhexidine Gluconate Cloth  6 each Topical Q0600  . docusate sodium  200 mg Oral BID  . enoxaparin (LOVENOX) injection  40 mg Subcutaneous Q24H  . feeding supplement (ENSURE ENLIVE)  237 mL Oral TID BM  . insulin aspart  0-5 Units Subcutaneous QHS  . insulin aspart  0-9 Units Subcutaneous TID WC  . lisinopril  2.5 mg Oral Daily  . methylPREDNISolone (SOLU-MEDROL) injection  40 mg Intravenous Daily  . multivitamin with minerals  1 tablet Oral Daily  . mupirocin ointment  1 application Nasal BID  . tamsulosin  0.4 mg Oral Daily   Continuous Infusions: . ceFEPime (MAXIPIME) IV Stopped (08/09/17 1017)    Assessment/Plan:  1. Healthcare associated pneumonia.  Patient is on cefepime and vancomycin.  Blood cultures negative.  Associated acute hypoxic respiratory  failure.  Continues to be short of breath.  Blood cultures negative.  Stop vancomycin. 2. COPD exacerbation.  Solu-Medrol and scheduled nebulizers. 3. Type 2 diabetes mellitus.  On sliding scale 4. Essential hypertension 5. Urinalysis also positive.  Urine culture ordered. 6. BPH on Flomax. 7. Severe malnutrition. Magic cup.  Consult dietitian 8. Stage II decubitus ulcer  Code Status:     Code Status Orders  (From admission, onward)        Start     Ordered   08/07/17 2021  Do not attempt resuscitation (DNR)  Continuous    Question Answer Comment  In the event of cardiac or respiratory ARREST Do not call a "code blue"   In the event of cardiac or respiratory ARREST Do not perform Intubation, CPR, defibrillation or ACLS   In the event of cardiac or respiratory ARREST Use medication by any route, position, wound care, and other measures to relive pain and suffering. May use oxygen, suction and manual treatment of airway obstruction as needed for comfort.      08/07/17 2020    Code Status History    Date Active Date Inactive Code Status Order ID Comments User Context   06/13/2017 20:50 06/15/2017 16:10 DNR [id number]  Demetrios Loll, MD Inpatient    Advance Directive Documentation     Most Recent Value  Type of Advance Directive  Out of facility DNR (pink MOST or yellow form)  Pre-existing out of facility DNR order (yellow form or pink MOST form)  Yellow form placed in chart (order not valid for inpatient use)  "MOST" Form in Place?  No data     Family Communication: Spoke with family at the bedside Disposition Plan: [facility]  Antibiotics:  Cefepime  Time spent: 35 minutes including ACP time  [place]

## 2017-08-09 NOTE — Progress Notes (Signed)
Advance care planning  Met with daughter at bedside.  Discussed regarding patient's pneumonia, hypoxic respiratory failure, poor oral intake.  Discussed and confirmed patient's DNR status.  He has had progressive decline since December when he fell.  Has been at [facility] recently a short-term rehab and transition to long-term care.  They have been unhappy with their experience at [facility] and are discussing with social work regarding a different facility.  Considering patient's decline and recurrent pneumonias with possible aspiration involved recommended hospice.  Advised we will add morphine as needed for work of breathing. Patient's family is meeting Education officer, museum at [employer] health care to discuss their concerns.  Once this is done patient will be discharged to [facility] with hospice.  Time spent 20 minutes

## 2017-08-10 ENCOUNTER — Inpatient Hospital Stay: Payer: Medicare Other

## 2017-08-10 LAB — GLUCOSE, CAPILLARY
GLUCOSE-CAPILLARY: 127 mg/dL — AB (ref 65–99)
GLUCOSE-CAPILLARY: 221 mg/dL — AB (ref 65–99)
GLUCOSE-CAPILLARY: 303 mg/dL — AB (ref 65–99)
Glucose-Capillary: 169 mg/dL — ABNORMAL HIGH (ref 65–99)
Glucose-Capillary: 258 mg/dL — ABNORMAL HIGH (ref 65–99)

## 2017-08-10 LAB — URINE CULTURE
Culture: 40000 — AB
SPECIAL REQUESTS: NORMAL

## 2017-08-10 LAB — CULTURE, RESPIRATORY W GRAM STAIN: Gram Stain: NONE SEEN

## 2017-08-10 LAB — CULTURE, RESPIRATORY

## 2017-08-10 NOTE — Progress Notes (Signed)
   08/10/17 2311  Clinical Encounter Type  Visited With Patient;Health care provider  Visit Type Initial (rapid response page)  Referral From Other (Comment) (rapid response page)  Consult/Referral To Chaplain   Chaplain received page for rapid response.  Chaplain arrived and waited outside patient room, maintaining pastoral presence and offering silent prayer.  Chaplain left after team completed care.

## 2017-08-10 NOTE — Progress Notes (Signed)
Patient ID: Tyler Kirk, male   DOB: 1919-09-17, 84 y.o.   MRN: [id number]  [employer] PROGRESS NOTE  Tyler Kirk RSW:[id number] DOB: 1919-11-07 DOA: 08/07/2017 PCP: Jerrol Banana., MD  HPI/Subjective: Patient is drowzy.  Daughter at bedside.  Poor oral intake.  Objective: Vitals:   08/09/17 2115 08/10/17 0440  BP:  (!) 169/77  Pulse: 74 67  Resp: 18 16  Temp:  98.4 F (36.9 C)  SpO2: 98% 100%    Filed Weights   08/07/17 1547 08/07/17 2021  Weight: 64.4 kg (142 lb) 64.3 kg (141 lb 12.8 oz)    ROS: Review of Systems  Unable to perform ROS: Acuity of condition  Respiratory: Positive for cough and shortness of breath.   Cardiovascular: Negative for chest pain.  Gastrointestinal: Negative for abdominal pain.   Exam: Physical Exam  HENT:  Nose: No mucosal edema.  Mouth/Throat: No oropharyngeal exudate or posterior oropharyngeal edema.  Eyes: Conjunctivae, EOM and lids are normal. Pupils are equal, round, and reactive to light.  Neck: No JVD present. Carotid bruit is not present. No edema present. No thyroid mass and no thyromegaly present.  Cardiovascular: S1 normal and S2 normal. Exam reveals no gallop.  No murmur heard. Pulses:      Dorsalis pedis pulses are 2+ on the right side, and 2+ on the left side.  Respiratory: He is in respiratory distress. He has decreased breath sounds in the right middle field, the right lower field, the left middle field and the left lower field. He has no wheezes. He has rhonchi in the right middle field, the right lower field, the left middle field and the left lower field. He has no rales.  GI: Soft. Bowel sounds are normal. There is no tenderness.  Musculoskeletal:       Right ankle: He exhibits swelling.       Left ankle: He exhibits swelling.  Lymphadenopathy:    He has no cervical adenopathy.  Neurological: He is alert.  Skin: Skin is warm. Nails show no clubbing.  Stage II decubitus ulcer on buttock.   Chronic lower extremity discoloration with brownish discoloration bilaterally  Psychiatric: He has a normal mood and affect.      Data Reviewed: Basic Metabolic Panel: Recent Labs  Lab 08/07/17 1551 08/08/17 0512 08/09/17 0402  NA 141 144  --   K 4.2 3.7  --   CL 103 106  --   CO2 30 29  --   GLUCOSE 232* 176*  --   BUN 36* 40*  --   CREATININE 0.84 0.65 0.73  CALCIUM 9.2 9.2  --    CBC: Recent Labs  Lab 08/07/17 1551 08/08/17 0512  WBC 10.8* 9.2  HGB 14.1 13.5  HCT 44.0 41.4  MCV 87.1 86.8  PLT 266 253   Cardiac Enzymes: Recent Labs  Lab 08/07/17 1551  TROPONINI 0.03*   BNP (last 3 results) Recent Labs    06/12/17 1657 06/13/17 1215  BNP 49 35.0    CBG: Recent Labs  Lab 08/09/17 1649 08/09/17 2206 08/09/17 2325 08/10/17 0751 08/10/17 1144  GLUCAP 194* 201* 198* 127* 169*    Recent Results (from the past 240 hour(s))  Urine Culture     Status: Abnormal   Collection Time: 08/07/17  8:19 PM  Result Value Ref Range Status   Specimen Description   Final    URINE, RANDOM Performed at [facility], [address], [place], [place] [address]  Special Requests   Final    Normal Performed at [facility], [address] [place], [place] [address]    Culture (A)  Final    40,000 COLONIES/mL METHICILLIN RESISTANT STAPHYLOCOCCUS AUREUS   Report Status 08/10/2017 FINAL  Final   Organism ID, Bacteria METHICILLIN RESISTANT STAPHYLOCOCCUS AUREUS (A)  Final      Susceptibility   Methicillin resistant staphylococcus aureus - MIC*    CIPROFLOXACIN >=8 RESISTANT Resistant     GENTAMICIN <=0.5 SENSITIVE Sensitive     NITROFURANTOIN <=16 SENSITIVE Sensitive     OXACILLIN >=4 RESISTANT Resistant     TETRACYCLINE >=16 RESISTANT Resistant     VANCOMYCIN 1 SENSITIVE Sensitive     TRIMETH/SULFA >=320 RESISTANT Resistant     CLINDAMYCIN >=8 RESISTANT Resistant     RIFAMPIN <=0.5 SENSITIVE Sensitive     Inducible Clindamycin NEGATIVE  Sensitive     * 40,000 COLONIES/mL METHICILLIN RESISTANT STAPHYLOCOCCUS AUREUS  Culture, blood (routine x 2) Call MD if unable to obtain prior to antibiotics being given     Status: None (Preliminary result)   Collection Time: 08/07/17  8:34 PM  Result Value Ref Range Status   Specimen Description BLOOD LAC  Final   Special Requests   Final    BOTTLES DRAWN AEROBIC AND ANAEROBIC Blood Culture adequate volume   Culture   Final    NO GROWTH 2 DAYS Performed at [facility], [address], [place], [place] [address]    Report Status PENDING  Incomplete  Culture, blood (routine x 2) Call MD if unable to obtain prior to antibiotics being given     Status: None (Preliminary result)   Collection Time: 08/07/17  8:39 PM  Result Value Ref Range Status   Specimen Description BLOOD RAC  Final   Special Requests   Final    BOTTLES DRAWN AEROBIC AND ANAEROBIC Blood Culture adequate volume   Culture   Final    NO GROWTH 2 DAYS Performed at [facility], [address] [place], [place] [address]    Report Status PENDING  Incomplete  MRSA PCR Screening     Status: Abnormal   Collection Time: 08/07/17 10:26 PM  Result Value Ref Range Status   MRSA by PCR POSITIVE (A) NEGATIVE Final    Comment:        The GeneXpert MRSA Assay (FDA approved for NASAL specimens only), is one component of a comprehensive MRSA colonization surveillance program. It is not intended to diagnose MRSA infection nor to guide or monitor treatment for MRSA infections. RESULT CALLED TO, READ BACK BY AND VERIFIED WITH: [name] ON 08/08/17 AT 0103 JAG Performed at [facility], [address] [place], [place] [address]   Culture, sputum-assessment     Status: None   Collection Time: 08/08/17  2:03 AM  Result Value Ref Range Status   Specimen Description EXPECTORATED SPUTUM  Final   Special Requests NONE  Final   Sputum evaluation   Final    THIS SPECIMEN IS ACCEPTABLE FOR SPUTUM  CULTURE Performed at [facility], [address] [place], [place] [address]    Report Status 08/08/2017 FINAL  Final  Culture, respiratory (NON-Expectorated)     Status: None   Collection Time: 08/08/17  2:03 AM  Result Value Ref Range Status   Specimen Description   Final    EXPECTORATED SPUTUM Performed at [facility], [address] [place], [place] [address]    Special Requests  Final    NONE Reflexed from [id number] Performed at [facility], [address] [place], [place] [address]    Gram Stain   Final    NO WBC SEEN FEW BUDDING YEAST SEEN Performed at [facility], [place] [address], [place], [place] [address]    Culture ABUNDANT CANDIDA TROPICALIS  Final   Report Status 08/10/2017 FINAL  Final     Studies: No results found.  Scheduled Meds: . acidophilus  1 capsule Oral Daily  . aspirin EC  81 mg Oral Daily  . budesonide (PULMICORT) nebulizer solution  0.5 mg Nebulization BID  . carvedilol  3.125 mg Oral BID WC  . Chlorhexidine Gluconate Cloth  6 each Topical Q0600  . docusate sodium  200 mg Oral BID  . enoxaparin (LOVENOX) injection  40 mg Subcutaneous Q24H  . feeding supplement (ENSURE ENLIVE)  237 mL Oral TID BM  . insulin aspart  0-5 Units Subcutaneous QHS  . insulin aspart  0-9 Units Subcutaneous TID WC  . ipratropium-albuterol  3 mL Nebulization BID  . lisinopril  2.5 mg Oral Daily  . methylPREDNISolone (SOLU-MEDROL) injection  40 mg Intravenous Daily  . multivitamin with minerals  1 tablet Oral Daily  . mupirocin ointment  1 application Nasal BID  . tamsulosin  0.4 mg Oral Daily   Continuous Infusions: . ceFEPime (MAXIPIME) IV Stopped (08/10/17 1245)    Assessment/Plan:  1. Healthcare associated pneumonia.  Patient is on cefepime and vancomycin.  Blood cultures negative.  Associated acute hypoxic respiratory failure.  Continues to be short of breath.  Blood cultures negative.  Stopped vancomycin. 2. COPD  exacerbation.   Nebulizers. Stopped steroids 3. Type 2 diabetes mellitus.  On sliding scale 4. Essential hypertension 5. Urinalysis also positive.  Urine culture ordered. 6. BPH on Flomax. 7. Severe malnutrition.  Consulted dietitian. Ensure 8. Stage II decubitus ulcer  Code Status:     Code Status Orders  (From admission, onward)        Start     Ordered   08/07/17 2021  Do not attempt resuscitation (DNR)  Continuous    Question Answer Comment  In the event of cardiac or respiratory ARREST Do not call a "code blue"   In the event of cardiac or respiratory ARREST Do not perform Intubation, CPR, defibrillation or ACLS   In the event of cardiac or respiratory ARREST Use medication by any route, position, wound care, and other measures to relive pain and suffering. May use oxygen, suction and manual treatment of airway obstruction as needed for comfort.      08/07/17 2020    Code Status History    Date Active Date Inactive Code Status Order ID Comments User Context   06/13/2017 20:50 06/15/2017 16:10 DNR [id number]  Demetrios Loll, MD Inpatient    Advance Directive Documentation     Most Recent Value  Type of Advance Directive  Out of facility DNR (pink MOST or yellow form)  Pre-existing out of facility DNR order (yellow form or pink MOST form)  Yellow form placed in chart (order not valid for inpatient use)  "MOST" Form in Place?  No data     Family Communication: Spoke with family at the bedside Disposition Plan: [facility]  Antibiotics:  Cefepime  Time spent: 35 minutes including ACP time  [place]

## 2017-08-10 NOTE — Significant Event (Signed)
Rapid Response Event Note  Overview: Time Called: 2310 Arrival Time: 2313 Event Type: Respiratory  Initial Focused Assessment: Upon room entry pt. Sitting up in bed with labored breathing on a non-rebreather.  Charolett Bumpers, care RN; Lelon Frohlich, [facility] all present.  Pt. Appeared red in the face- which care RN stated was a change. Care RN reported pt. Had been on 3 L [place] with SpO2 of 93%, pt. Drank some water, had a coughing spell and dropped to an SpO2 sat of 88%.  Pt. Placed on non-rebreather.  Pt. Breath sounds diminished with some crackles which RT reported baseline from earlier in shift. VSS, except RR in mid 30's- see EMR for details.  All details of assessment reported to Dr. Jannifer Franklin.  Interventions: Dr. Jannifer Franklin ordered stat CXR, pt. Had ABX coverage already.    Plan of Care (if not transferred): MD wanted to give pt. Some time to recover on non-rebreather. If pt. Doesn't improve, MD is considering Bipap.  All was relayed to the care RN.  Instructed care RN to page MD or call a Rapid if the pt. Worsens.  Event Summary: Name of Physician Notified: Dr. Jannifer Franklin at 2317    at    Outcome: Stayed in room and stabalized  Event End Time: [place]

## 2017-08-10 NOTE — Progress Notes (Signed)
RT called to rapid response. Pt in resp distress with O2 desat on 3 L. Placed on non-rebr mask with sat improving to upper 90's. BS diminished with fine basilar crackles. Awaiting CXR & further orders.

## 2017-08-10 NOTE — Progress Notes (Signed)
2310: called to room by NT, patient red-faced, c/o SOB, "Can't catch my breath"; tachypnic 36-38; O2 sat 88% on 3l, now on 4Lpm; RRT called; non-rebreather mask applied with RT; ICU RN in attendance; Dr. Jannifer Franklin notified; [facility] ordered for possible aspiration after drinking thin liquids; will hold all thin liquids at this time; encouraged patient to Cough and deep breathe; Patient states that he did not want his family called at this time; Resting at this time, states that he was feeling a little better, continues to be red-faced;  Waiting for PCXR; O2 sats now 98% on non-rebreather mask. Barbaraann Faster, RN 11:30 PM; 08/10/2017

## 2017-08-10 NOTE — Progress Notes (Signed)
Patient less red in face, RR even/unlabored at this time; O2 sat 100% on non-rebreather mask; PCXR completed; awaiting results; continue to monitor; Tilda Samudio K, RN11:59 PM dermatitis

## 2017-08-11 LAB — GLUCOSE, CAPILLARY
GLUCOSE-CAPILLARY: 244 mg/dL — AB (ref 65–99)
Glucose-Capillary: 150 mg/dL — ABNORMAL HIGH (ref 65–99)

## 2017-08-11 MED ORDER — CIPROFLOXACIN HCL 500 MG PO TABS: 500.0000 mg | ORAL_TABLET | Freq: Two times a day (BID) | ORAL | 0 refills | Status: DC

## 2017-08-11 MED ORDER — LEVOFLOXACIN 250 MG PO TABS: 250.0000 mg | ORAL_TABLET | Freq: Every day | ORAL | 0 refills | Status: AC

## 2017-08-11 MED ORDER — MORPHINE SULFATE (CONCENTRATE) 10 MG/0.5ML PO SOLN: 5.0000 mg | ORAL | 0 refills | Status: AC | PRN

## 2017-08-11 MED ORDER — GABAPENTIN 300 MG PO CAPS: 300.0000 mg | ORAL_CAPSULE | Freq: Two times a day (BID) | ORAL | 0 refills | Status: AC

## 2017-08-11 MED ORDER — SODIUM CHLORIDE 0.9 % IV SOLN
2.0000 g | Freq: Two times a day (BID) | INTRAVENOUS | Status: DC
  Administered 2017-08-11: 2 g via INTRAVENOUS
  Filled 2017-08-11 (×3): qty 2

## 2017-08-11 MED ORDER — GABAPENTIN 300 MG PO CAPS: 300.0000 mg | ORAL_CAPSULE | Freq: Two times a day (BID) | ORAL | Status: DC

## 2017-08-11 NOTE — Discharge Instructions (Signed)
Regular diet with nectar thickened liquids

## 2017-08-11 NOTE — Progress Notes (Signed)
Follow up visit made to new referral for [facility] services at [facility]. Patient seen sitting up in bed, lunch tray on over bed table in front of him, family at bedside. Plan remains for patient to return back to [facility] via EMS today. Writer spoke with patient's daughter's [name] and Rodena Piety, they are aware of discharge plan and in agreement with hospice services. Flo Shanks RN, BSN, [place] [facility], hospital Liaison [phone]

## 2017-08-11 NOTE — Care Management Important Message (Signed)
Important Message  Patient Details  Name: Tyler Kirk MRN: [id number] Date of Birth: [birth year]   Medicare Important Message Given:  Yes    Shelbie Ammons, RN 08/11/2017, 7:11 AM

## 2017-08-11 NOTE — Progress Notes (Signed)
PT Cancellation Note  Patient Details Name: Tyler Kirk MRN: [id number] DOB: [birth year]   Cancelled Treatment:    Reason Eval/Treat Not Completed: Patient declined, no reason specified. Order received and chart reviewed. Spoke with [place] who reports that family/patient are considering Hospice care at facility. Will depend on decisions made at [facility]. Spoke with patient who reports that he has no desire to work with therapy. Daughter agrees. Pt is LTC private pay at [facility]. Per notes in chart facility agrees to receive patient at discharge. No PT needs identified and pt does not want therapy. Will complete order. Please enter new order if status or needs change.  Lyndel Safe Shirleen Mcfaul PT, DPT   [place] 08/11/2017, 1:30 PM

## 2017-08-11 NOTE — Discharge Summary (Signed)
[facility] [place] NAME: Tyler Kirk    MR#:  [id number]  DATE OF BIRTH:  1919-07-27  DATE OF ADMISSION:  08/07/2017 ADMITTING PHYSICIAN: Demetrios Loll, MD  DATE OF DISCHARGE: 08/11/2017  PRIMARY CARE PHYSICIAN: Jerrol Banana., MD   ADMISSION DIAGNOSIS:  Healthcare-associated pneumonia [J18.9] Acute respiratory failure with hypoxia ([place]) [J96.01]  DISCHARGE DIAGNOSIS:  Active Problems:   Pneumonia   Protein-calorie malnutrition, severe   Pressure injury of skin   SECONDARY DIAGNOSIS:   Past Medical History:  Diagnosis Date  . Afib ([place])   . Arthritis   . BPH (benign prostatic hypertrophy)   . Cataracts, bilateral   . Depression   . Diabetes mellitus without complication ([place])   . GERD (gastroesophageal reflux disease)   . Hernia, inguinal    Chronic Large Inguinal Hernia  . Hyperlipidemia   . Hypertension   . Prostate cancer ([place])   . Pulmonary embolism ([place])   . UTI (urinary tract infection)      ADMITTING HISTORY  HISTORY OF PRESENT ILLNESS:  Tyler Kirk  is a 84 y.o. male with a known history of multiple medical problems as below.  Patient was sent to ED from nursing home due to above chief complaints.  The patient is demented and confused.  Per ED physician, the patient was sent here due to generalized weakness.  According to his daughters, the patient's body is very hot and has cough for the past 2 weeks.  The patient also has worsening shortness of breath.  ED physician requested admission for pneumonia and gave antibiotics.  HOSPITAL COURSE:   1. Healthcare associated pneumonia.  Patient treated with broad-spectrum antibiotics.  He has had recurrent pneumonias and progressive decline.  Cultures have been negative.  No significant improvement in his respiratory status.  Continues to be weak, wheezing.  At this time patient is being discharged back to his long-term care with hospice services.  Patient has  high risk for deterioration and death.  Transition to comfort measures if any worsening. 2. COPD exacerbation.   Nebulizers. Stopped steroids 3. Type 2 diabetes mellitus.  On sliding scale 4. Essential hypertension 5. UTI.  MRSA. has been on vancomycin.  Now stopped. 6. BPH on Flomax. 7. Severe malnutrition. Ensure 8. Stage II decubitus ulcer  Patient is being transferred to long-term care with hospice following.  Poor prognosis.  Added morphine as needed.   CONSULTS OBTAINED:    DRUG ALLERGIES:   Allergies  Allergen Reactions  . Celexa [Citalopram Hydrobromide]     diarrhea    DISCHARGE MEDICATIONS:   Allergies as of 08/11/2017      Reactions   Celexa [citalopram Hydrobromide]    diarrhea      Medication List    STOP taking these medications   HYDROcodone-acetaminophen 5-325 MG tablet Commonly known as:  NORCO/VICODIN   metoprolol tartrate 25 MG tablet Commonly known as:  LOPRESSOR     TAKE these medications   acetaminophen 325 MG tablet Commonly known as:  TYLENOL Take 2 tablets (650 mg total) by mouth every 6 (six) hours as needed for mild pain (or Fever >/= 101).   aspirin 81 MG tablet Take 1 tablet (81 mg total) by mouth daily.   bifidobacterium infantis capsule Take 1 capsule by mouth daily.   carvedilol 3.125 MG tablet Commonly known as:  COREG Take 3.125 mg by mouth 2 (two) times daily with a meal.   docusate sodium 250 MG  capsule Commonly known as:  COLACE Take 250 mg by mouth daily as needed.   fluticasone 50 MCG/ACT nasal spray Commonly known as:  FLONASE Place 2 sprays into both nostrils daily as needed.   gabapentin 300 MG capsule Commonly known as:  NEURONTIN Take 1 capsule (300 mg total) by mouth 2 (two) times daily.   glucose blood test strip Commonly known as:  ONE TOUCH ULTRA TEST 1 each by Other route every morning. Use as instructed Dx:E11.9   ipratropium-albuterol 0.5-2.5 (3) MG/3ML Soln Commonly known as:  DUONEB Take 3  mLs by nebulization every 4 (four) hours as needed.   levofloxacin 250 MG tablet Commonly known as:  LEVAQUIN Take 1 tablet (250 mg total) by mouth daily.   lisinopril 2.5 MG tablet Commonly known as:  PRINIVIL,ZESTRIL Take 2.5 mg by mouth daily.   mometasone 0.1 % cream Commonly known as:  ELOCON Apply 1 application topically 2 (two) times daily as needed.   morphine CONCENTRATE 10 MG/0.5ML Soln concentrated solution Take 0.25 mLs (5 mg total) by mouth every 4 (four) hours as needed for severe pain or shortness of breath.   Multiple Vitamin tablet Take 1 tablet by mouth daily.   sitaGLIPtin 50 MG tablet Commonly known as:  JANUVIA Take 50 mg by mouth daily.   tamsulosin 0.4 MG Caps capsule Commonly known as:  FLOMAX Take 1 capsule by mouth daily.            Durable Medical Equipment  (From admission, onward)        Start     Ordered   08/11/17 1323  For home use only DME oxygen  Once    Question Answer Comment  Mode or (Route) Nasal cannula   Liters per Minute 2   Frequency Continuous (stationary and portable oxygen unit needed)   Oxygen delivery system Gas      08/11/17 1322      Today   VITAL SIGNS:  Blood pressure (!) 173/78, pulse 74, temperature 97.7 F (36.5 C), temperature source Oral, resp. rate (!) 24, height 5\' 8"  (1.727 m), weight 64.3 kg (141 lb 12.8 oz), SpO2 97 %.  I/O:    Intake/Output Summary (Last 24 hours) at 08/11/2017 1358 Last data filed at 08/11/2017 0500 Gross per 24 hour  Intake 50 ml  Output 250 ml  Net -200 ml    PHYSICAL EXAMINATION:  Physical Exam  GENERAL:  84 y.o.-year-old patient lying in the bed LUNGS: Bilateral wheezing CARDIOVASCULAR: S1, S2 normal. No murmurs, rubs, or gallops.  ABDOMEN: Soft, non-tender, non-distended. Bowel sounds present. No organomegaly or mass.  NEUROLOGIC: Moves all 4 extremities. PSYCHIATRIC: The patient is drowzy  DATA REVIEW:   CBC Recent Labs  Lab 08/08/17 0512  WBC 9.2   HGB 13.5  HCT 41.4  PLT 253    Chemistries  Recent Labs  Lab 08/08/17 0512 08/09/17 0402  NA 144  --   K 3.7  --   CL 106  --   CO2 29  --   GLUCOSE 176*  --   BUN 40*  --   CREATININE 0.65 0.73  CALCIUM 9.2  --     Cardiac Enzymes Recent Labs  Lab 08/07/17 1551  TROPONINI 0.03*    Microbiology Results  Results for orders placed or performed during the hospital encounter of 08/07/17  Urine Culture     Status: Abnormal   Collection Time: 08/07/17  8:19 PM  Result Value Ref Range Status   Specimen  Description   Final    URINE, RANDOM Performed at [facility], [address] [place], [place] [address]    Special Requests   Final    Normal Performed at [facility], [address] [place], [place] [address]    Culture (A)  Final    40,000 COLONIES/mL METHICILLIN RESISTANT STAPHYLOCOCCUS AUREUS   Report Status 08/10/2017 FINAL  Final   Organism ID, Bacteria METHICILLIN RESISTANT STAPHYLOCOCCUS AUREUS (A)  Final      Susceptibility   Methicillin resistant staphylococcus aureus - MIC*    CIPROFLOXACIN >=8 RESISTANT Resistant     GENTAMICIN <=0.5 SENSITIVE Sensitive     NITROFURANTOIN <=16 SENSITIVE Sensitive     OXACILLIN >=4 RESISTANT Resistant     TETRACYCLINE >=16 RESISTANT Resistant     VANCOMYCIN 1 SENSITIVE Sensitive     TRIMETH/SULFA >=320 RESISTANT Resistant     CLINDAMYCIN >=8 RESISTANT Resistant     RIFAMPIN <=0.5 SENSITIVE Sensitive     Inducible Clindamycin NEGATIVE Sensitive     * 40,000 COLONIES/mL METHICILLIN RESISTANT STAPHYLOCOCCUS AUREUS  Culture, blood (routine x 2) Call MD if unable to obtain prior to antibiotics being given     Status: None (Preliminary result)   Collection Time: 08/07/17  8:34 PM  Result Value Ref Range Status   Specimen Description BLOOD LAC  Final   Special Requests   Final    BOTTLES DRAWN AEROBIC AND ANAEROBIC Blood Culture adequate volume   Culture   Final    NO GROWTH 4 DAYS Performed at  [facility], [address], [place], [place] [address]    Report Status PENDING  Incomplete  Culture, blood (routine x 2) Call MD if unable to obtain prior to antibiotics being given     Status: None (Preliminary result)   Collection Time: 08/07/17  8:39 PM  Result Value Ref Range Status   Specimen Description BLOOD RAC  Final   Special Requests   Final    BOTTLES DRAWN AEROBIC AND ANAEROBIC Blood Culture adequate volume   Culture   Final    NO GROWTH 4 DAYS Performed at [facility], [address], [place], [place] [address]    Report Status PENDING  Incomplete  MRSA PCR Screening     Status: Abnormal   Collection Time: 08/07/17 10:26 PM  Result Value Ref Range Status   MRSA by PCR POSITIVE (A) NEGATIVE Final    Comment:        The GeneXpert MRSA Assay (FDA approved for NASAL specimens only), is one component of a comprehensive MRSA colonization surveillance program. It is not intended to diagnose MRSA infection nor to guide or monitor treatment for MRSA infections. RESULT CALLED TO, READ BACK BY AND VERIFIED WITH: [name] ON 08/08/17 AT 0103 JAG Performed at [facility], [address] [place], [place] [address]   Culture, sputum-assessment     Status: None   Collection Time: 08/08/17  2:03 AM  Result Value Ref Range Status   Specimen Description EXPECTORATED SPUTUM  Final   Special Requests NONE  Final   Sputum evaluation   Final    THIS SPECIMEN IS ACCEPTABLE FOR SPUTUM CULTURE Performed at [facility], [address], [place],  [address]    Report Status 08/08/2017 FINAL  Final  Culture, respiratory (NON-Expectorated)     Status: None   Collection Time: 08/08/17  2:03 AM  Result Value Ref Range Status   Specimen Description   Final  EXPECTORATED SPUTUM Performed at [facility], [address] [place], [place] [address]    Special Requests   Final    NONE Reflexed from [id number] Performed at  [facility], [address] [place], [place] [address]    Gram Stain   Final    NO WBC SEEN FEW BUDDING YEAST SEEN Performed at [facility], [place] [address] [place], [place] [address]    Culture ABUNDANT CANDIDA TROPICALIS  Final   Report Status 08/10/2017 FINAL  Final    RADIOLOGY:  Dg Chest Port 1 View  Result Date: 08/10/2017 CLINICAL DATA:  Acute onset of shortness of breath. EXAM: PORTABLE CHEST 1 VIEW COMPARISON:  Chest radiograph performed 08/07/2017, and CTA of the chest performed 06/13/2017 FINDINGS: Chronic bilateral reticular opacities, with sparing of the right middle lobe, are grossly unchanged from the prior study. No superimposed focal airspace consolidation is seen. There is increased elevation of the right hemidiaphragm. No pleural effusion or pneumothorax is identified. The cardiomediastinal silhouette is borderline normal in size. No acute osseous abnormalities are identified. IMPRESSION: Stable chronic bilateral reticular opacities. No superimposed focal airspace consolidation seen. Elevation of the right hemidiaphragm. Electronically Signed   By: Garald Balding M.D.   On: 08/10/2017 23:58    Follow up with PCP in 1 week.  Management plans discussed with the patient, family and they are in agreement.  CODE STATUS:     Code Status Orders  (From admission, onward)        Start     Ordered   08/07/17 2021  Do not attempt resuscitation (DNR)  Continuous    Question Answer Comment  In the event of cardiac or respiratory ARREST Do not call a "code blue"   In the event of cardiac or respiratory ARREST Do not perform Intubation, CPR, defibrillation or ACLS   In the event of cardiac or respiratory ARREST Use medication by any route, position, wound care, and other measures to relive pain and suffering. May use oxygen, suction and manual treatment of airway obstruction as needed for comfort.      08/07/17 2020    Code Status History    Date Active  Date Inactive Code Status Order ID Comments User Context   06/13/2017 20:50 06/15/2017 16:10 DNR [id number]  Demetrios Loll, MD Inpatient    Advance Directive Documentation     Most Recent Value  Type of Advance Directive  Out of facility DNR (pink MOST or yellow form)  Pre-existing out of facility DNR order (yellow form or pink MOST form)  Yellow form placed in chart (order not valid for inpatient use)  "MOST" Form in Place?  No data      TOTAL TIME TAKING CARE OF THIS PATIENT ON DAY OF DISCHARGE: more than 30 minutes.   Leia Alf Quandra Fedorchak M.D on 08/11/2017 at 1:58 PM  Between 7am to 6pm - Pager - [phone]  After 6pm go to [email or web address] - password [email or web address] [place] [facility]  Office  [phone]  CC: Primary care physician; Jerrol Banana., MD  Note: This dictation was prepared with Dragon dictation along with smaller phrase technology. Any transcriptional errors that result from this process are unintentional.

## 2017-08-11 NOTE — Progress Notes (Signed)
Patient is medically stable for D/C back to [facility] today with [facility]. Per [facility] admissions coordinator at [facility] patient is a long term care resident and can return today with [facility]. RN will call report and arrange EMS for transport. Clinical Education officer, museum (CSW) sent D/C orders to [facility] via [place]. Patient's daughter's Leda Gauze and [name] are aware of D/C today. [facility] hospice liaison is aware of above. Please reconsult if future social work needs arise. CSW signing off.   [employer], LCSW [phone]

## 2017-08-12 LAB — CULTURE, BLOOD (ROUTINE X 2)
CULTURE: NO GROWTH
CULTURE: NO GROWTH
Special Requests: ADEQUATE
Special Requests: ADEQUATE

## 2017-08-26 ENCOUNTER — Ambulatory Visit: Payer: Medicare Other | Admitting: Family Medicine

## 2017-08-29 DEATH — deceased

## 2018-11-24 IMAGING — CT CT ANGIO CHEST
2 of 6 series · 18 of 46 positions shown · IV contrast (APPLIED)
Comparison: None.

CLINICAL DATA: Shortness of breath and right-sided chest pain after
a fall 2 days ago.

EXAM:
CT ANGIOGRAPHY CHEST WITH CONTRAST
TECHNIQUE: Multidetector CT imaging of the chest was performed using the
standard protocol during bolus administration of intravenous
contrast. Multiplanar CT image reconstructions and MIPs were
obtained to evaluate the vascular anatomy.
CONTRAST:  75mL 1W3B37-T7P IOPAMIDOL ([id number]) INJECTION 76%

[Series 5: thins · axial · 0.71mm/px · z∈[-327,-95]mm · 15 of 256 slices shown]
[im 12/256  lung]
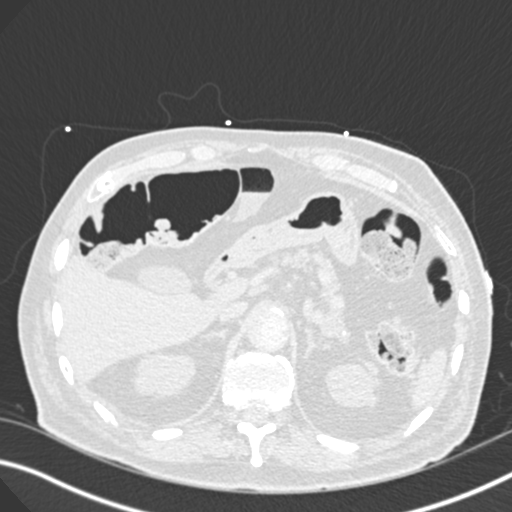
[im 34/256  soft-tissue]
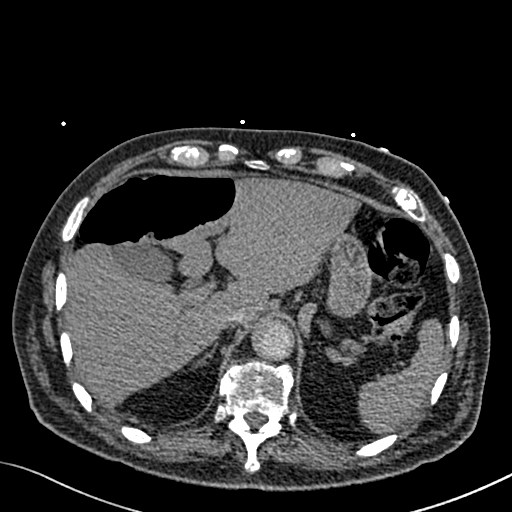
[im 45/256  lung]
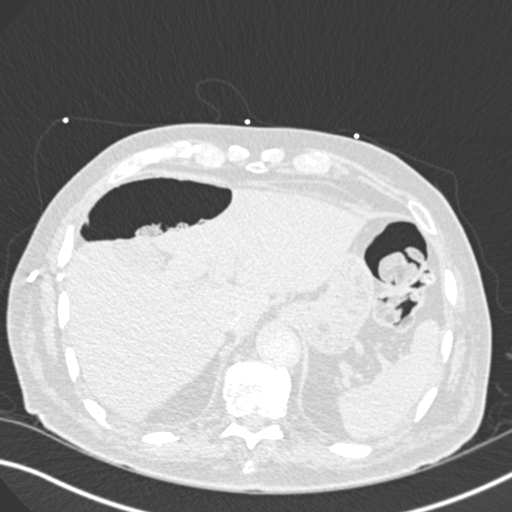
[im 67/256  soft-tissue]
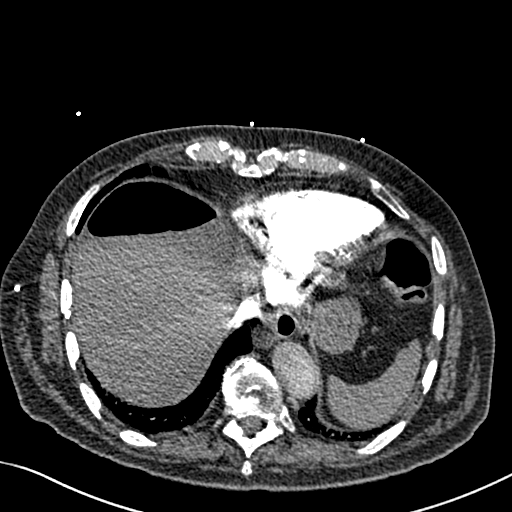
[im 78/256  lung]
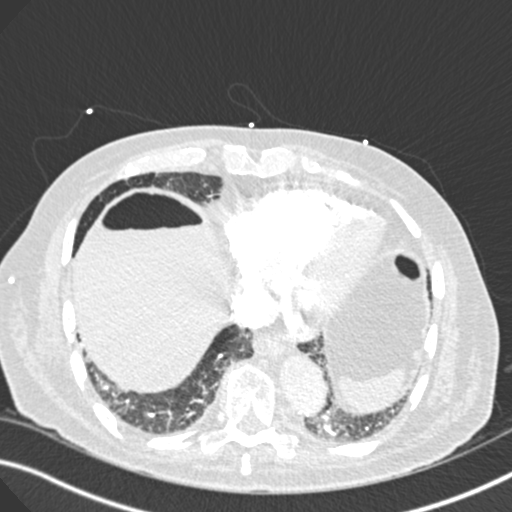
[im 100/256  soft-tissue]
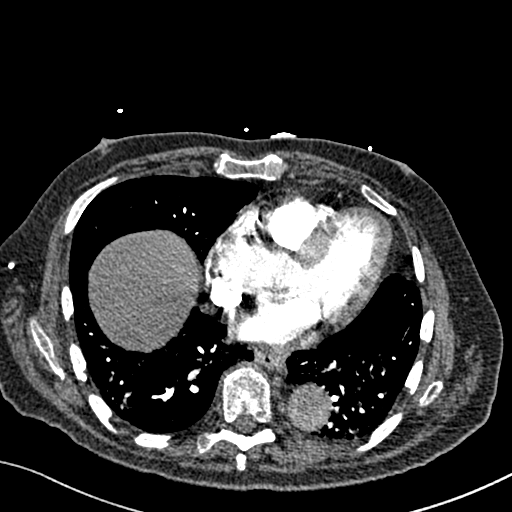
[im 111/256  lung]
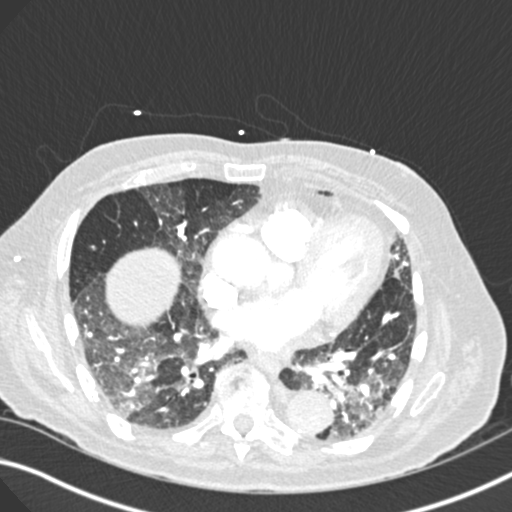
[im 134/256  soft-tissue]
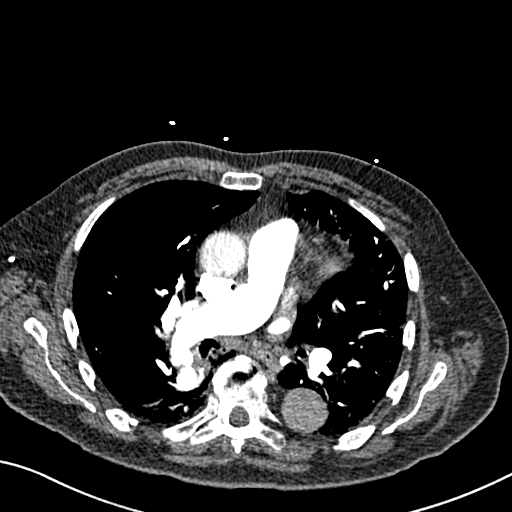
[im 145/256  lung]
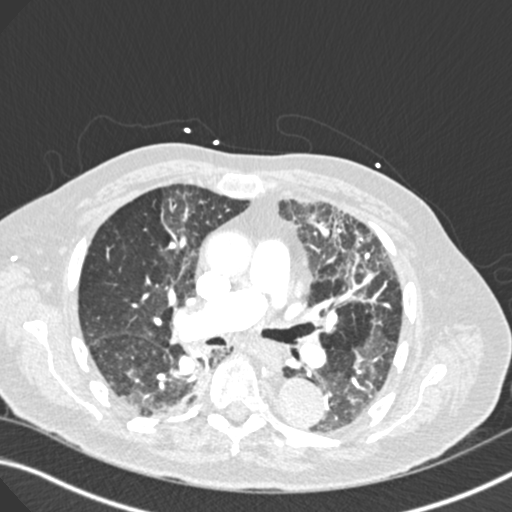
[im 156/256  soft-tissue]
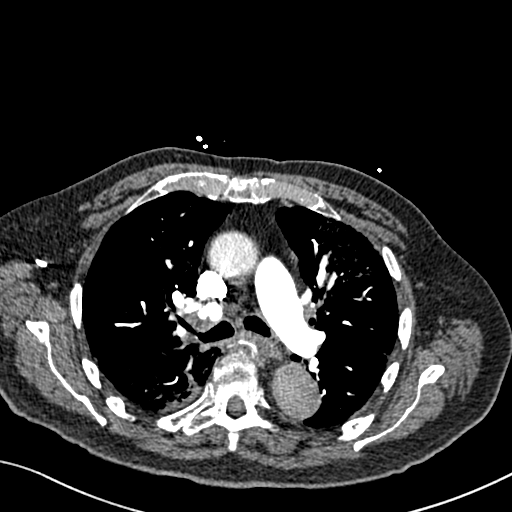
[im 178/256  lung]
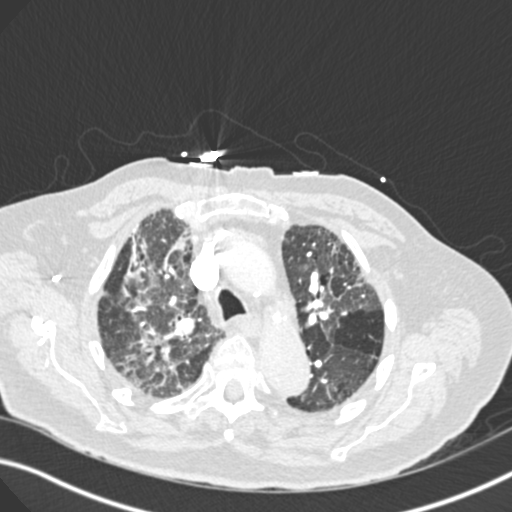
[im 189/256  soft-tissue]
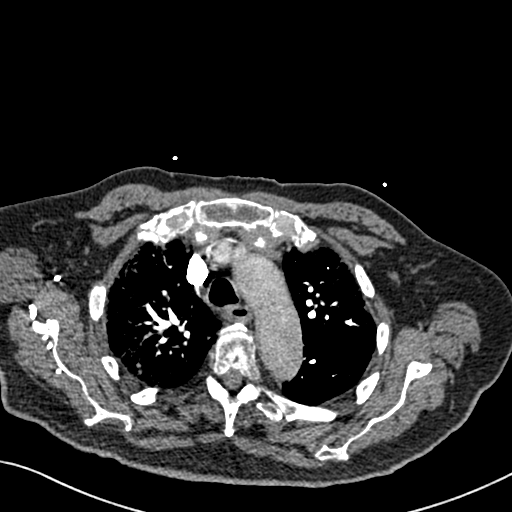
[im 211/256  lung]
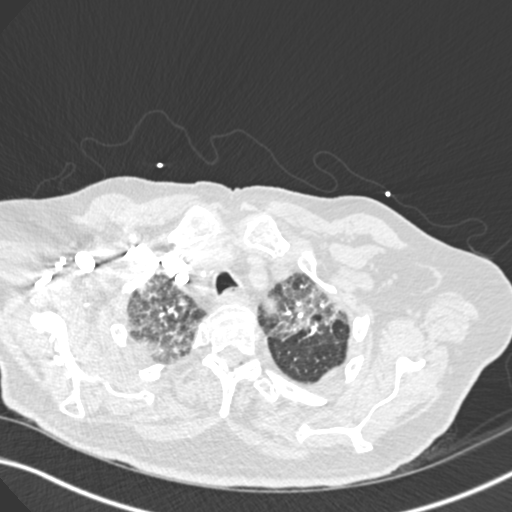
[im 222/256  soft-tissue]
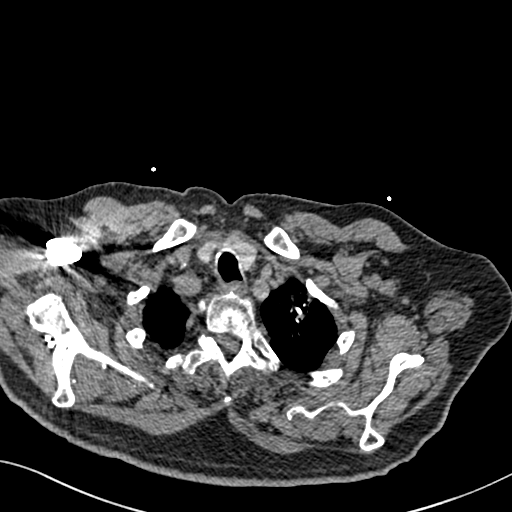
[im 244/256  lung]
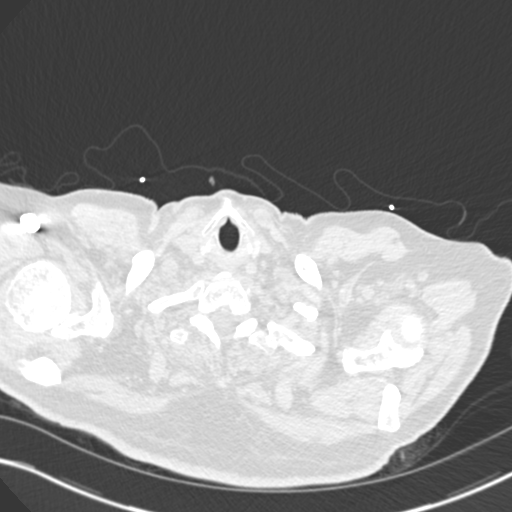

[Series 7: coronal mpr · coronal · 0.52mm/px · 3 of 80 slices shown]
[im 20/80  soft-tissue]
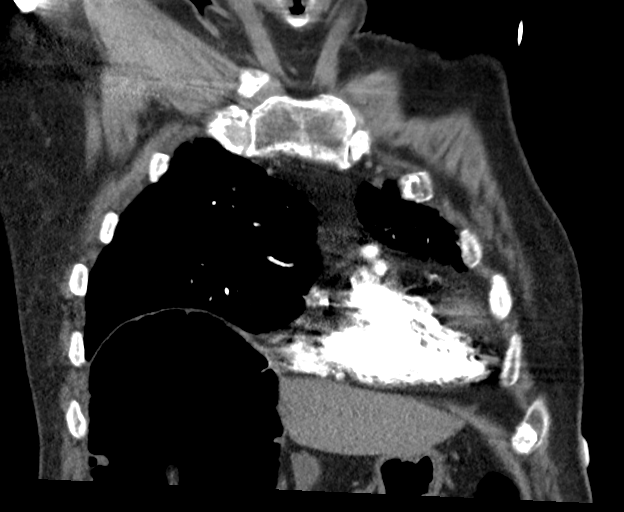
[im 40/80  soft-tissue]
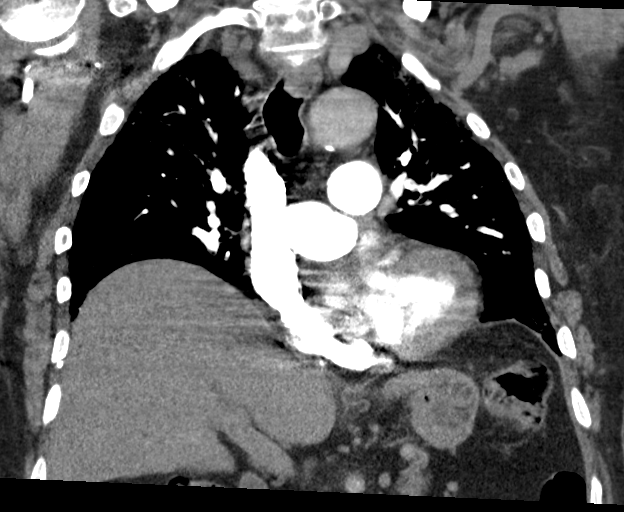
[im 60/80  soft-tissue]
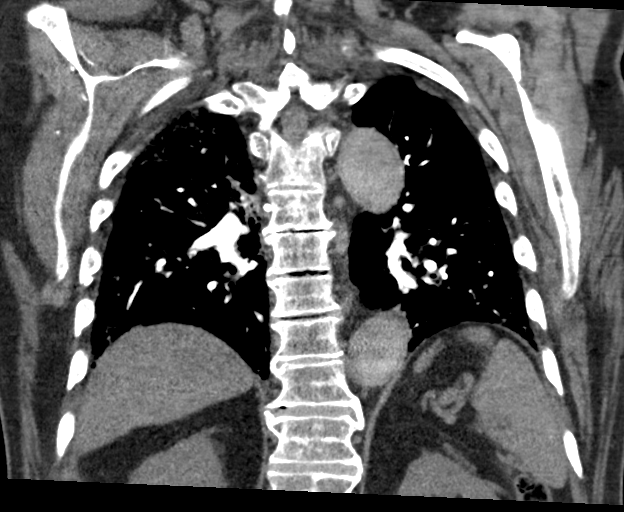

[18 of 46 positions shown; findings below may reference images not displayed]

FINDINGS: Cardiovascular: The heart is normal in size for age. No pericardial
effusion. There is tortuosity and mild ectasia of the thoracic aorta
but no aneurysm.

The pulmonary arteries are enlarged suggesting pulmonary
hypertension. There are well opacified and no filling defects are
identified to suggest pulmonary embolism.

Mediastinum/Nodes: Small mediastinal and right hilar lymph nodes
likely inflammatory/ hyperplastic. No mass or overt adenopathy. The
esophagus is grossly normal.

Lungs/Pleura: Mosaic pattern of attenuation in the lungs could be
due to acute bronchiolitis, constrictive bronchiolitis, reactive
airways disease, cryptogenic organizing pneumonia or
hypersensitivity pneumonitis. No focal airspace consolidation or
pleural effusion.

Upper Abdomen: Dilated colon is noted with colonic interposition.
Pancreatic calcifications suggesting chronic calcific pancreatitis.

Musculoskeletal: No chest wall mass, supraclavicular or axillary
lymphadenopathy.

No acute bony findings. No definite sternal, thoracic vertebral body
or rib fractures.

Review of the MIP images confirms the above findings.
IMPRESSION: 1. No CT findings for pulmonary embolism.
2. Enlarged pulmonary arteries suggesting pulmonary hypertension.
3. Mosaic pattern of lung attenuation.  Please see above discussion.
4. No focal infiltrates or effusions.
5. Intact bony thorax.

Aortic Atherosclerosis ([id number]).

## 2018-11-24 IMAGING — CR DG TIBIA/FIBULA 2V*L*
4 series · 4 of 4 positions shown · non-contrast
Comparison: None.

CLINICAL DATA: Left leg pain after syncopal episode.

EXAM:
LEFT TIBIA AND FIBULA - 2 VIEW

[tibia ap (1 of 2)]
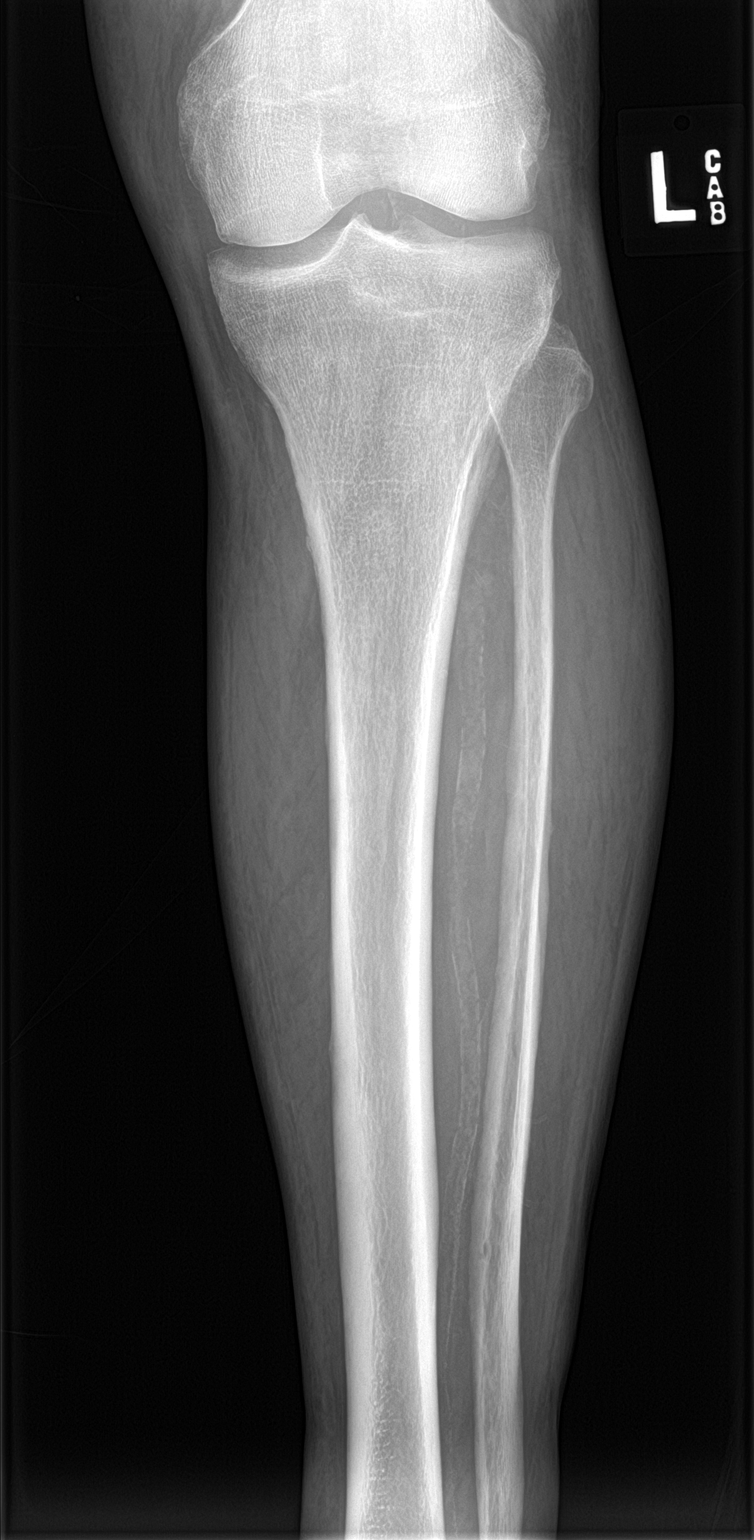

[tibia ap (2 of 2)]
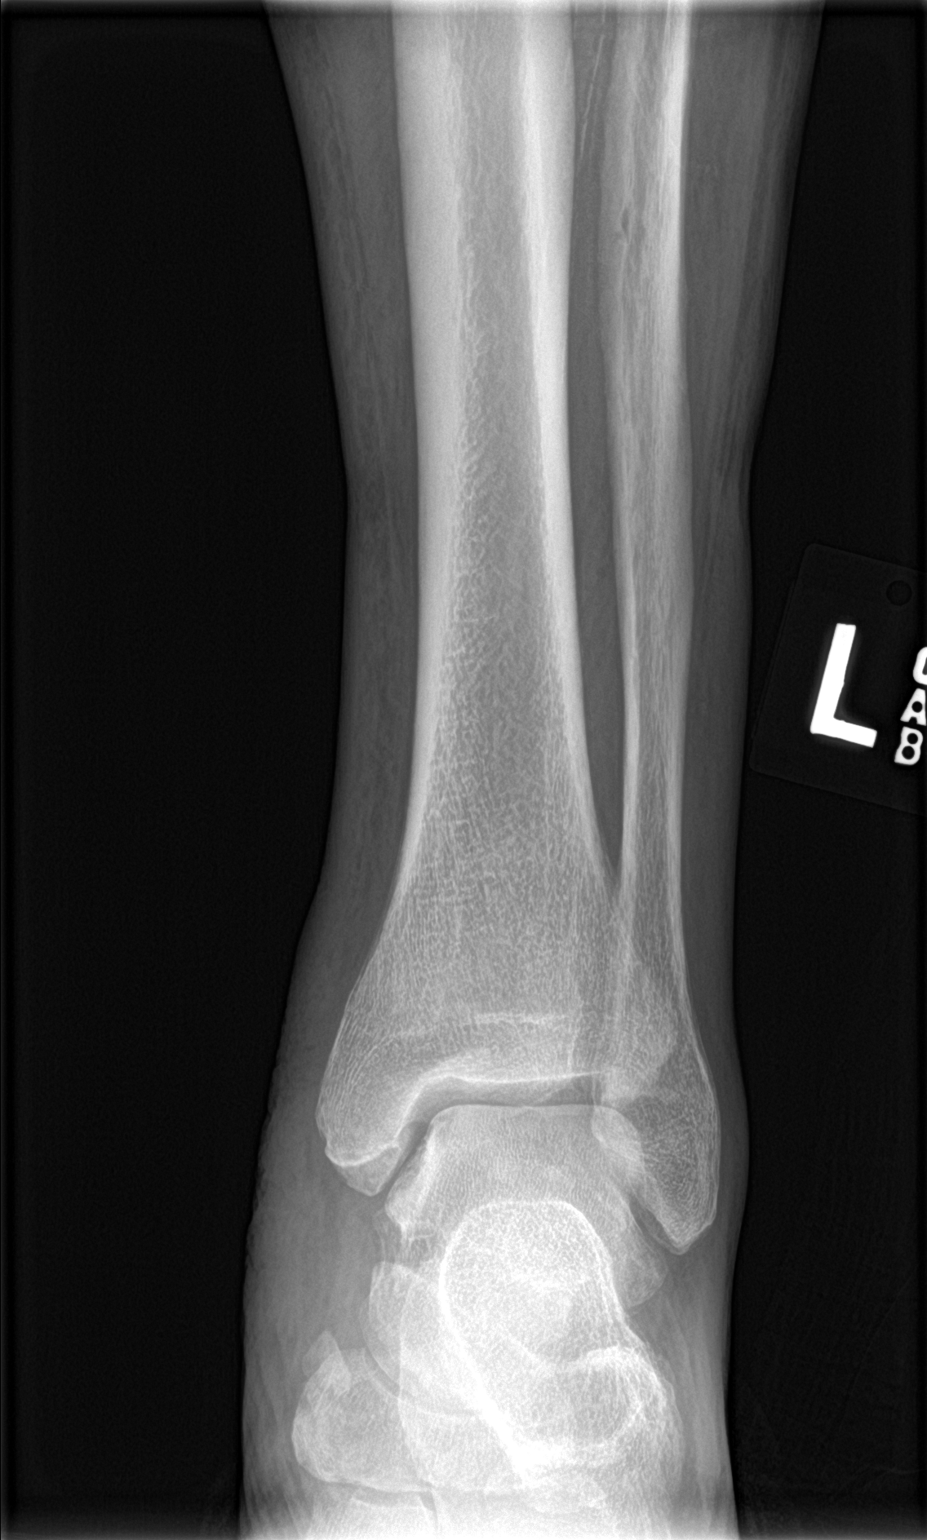

[tibia lat (1 of 2)]
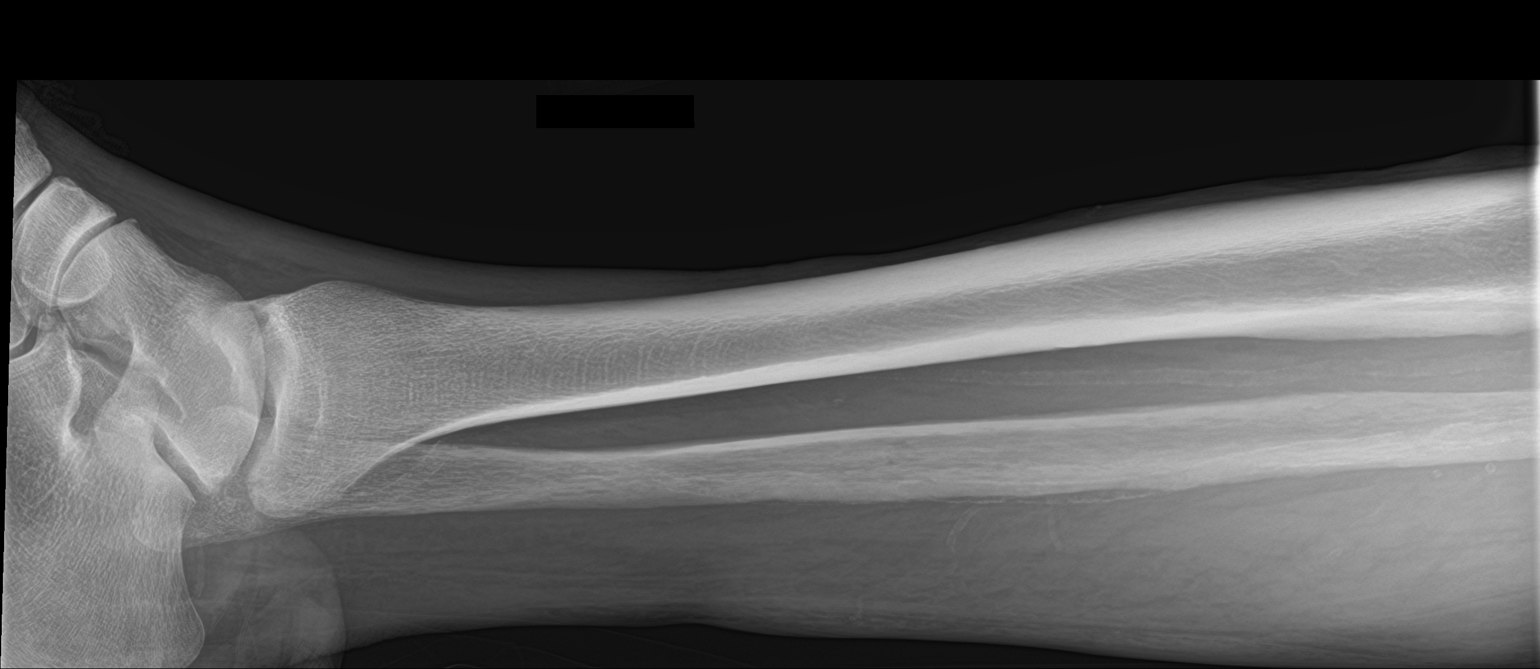

[tibia lat (2 of 2)]
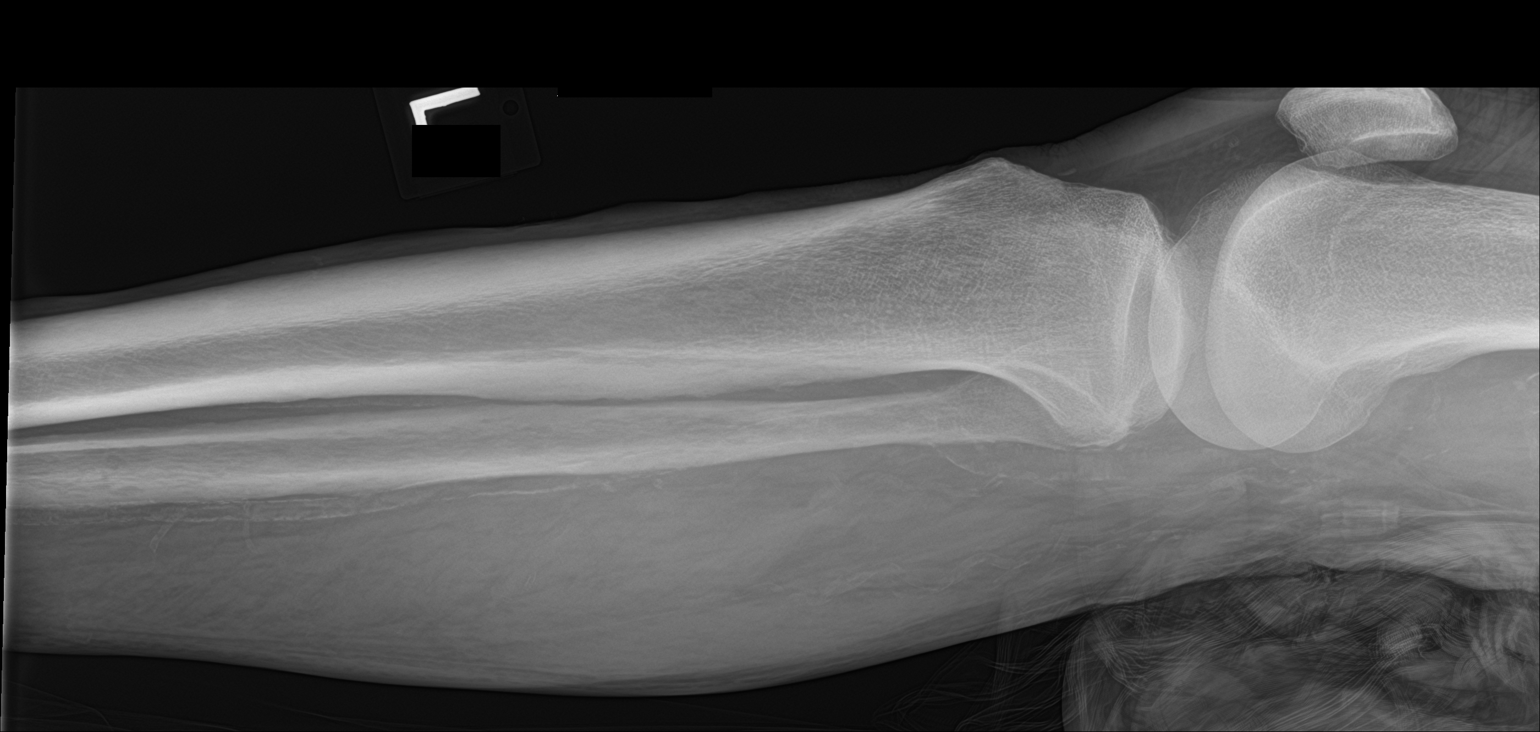

[4 of 4 positions shown; findings below may reference images not displayed]

FINDINGS: There is no evidence of fracture, joint dislocations or other focal
bone lesions. Soft tissues are unremarkable. Tibial arteriosclerosis
is noted.
IMPRESSION: Negative for acute fracture nor joint dislocations.

## 2018-11-24 IMAGING — CR DG CHEST 2V
2 series · 2 of 2 positions shown · non-contrast
Comparison: 11/20/2010

CLINICAL DATA: Short of breath. Right-sided pain. Fall 2 days ago.

EXAM:
CHEST  2 VIEW

[chest lat]
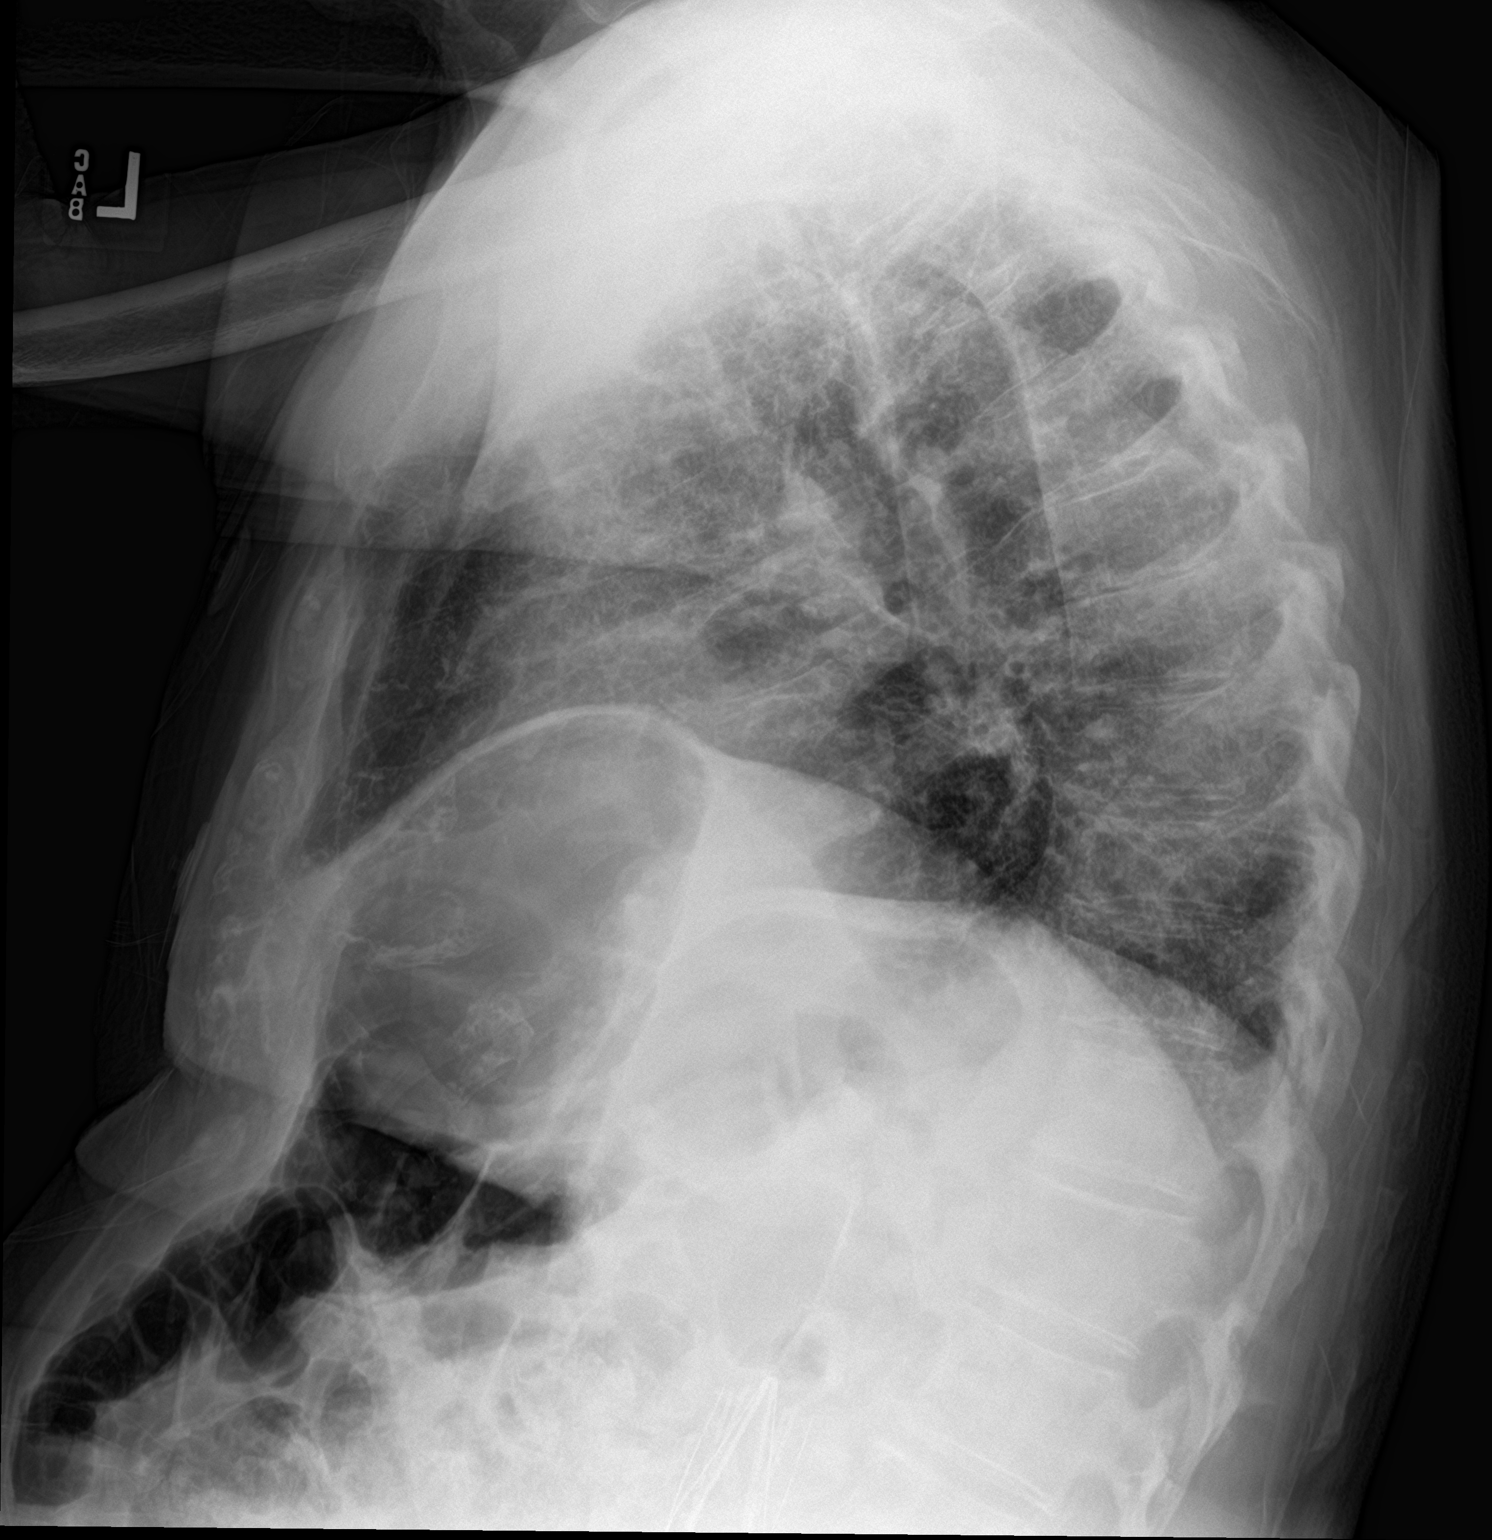

[chest ap]
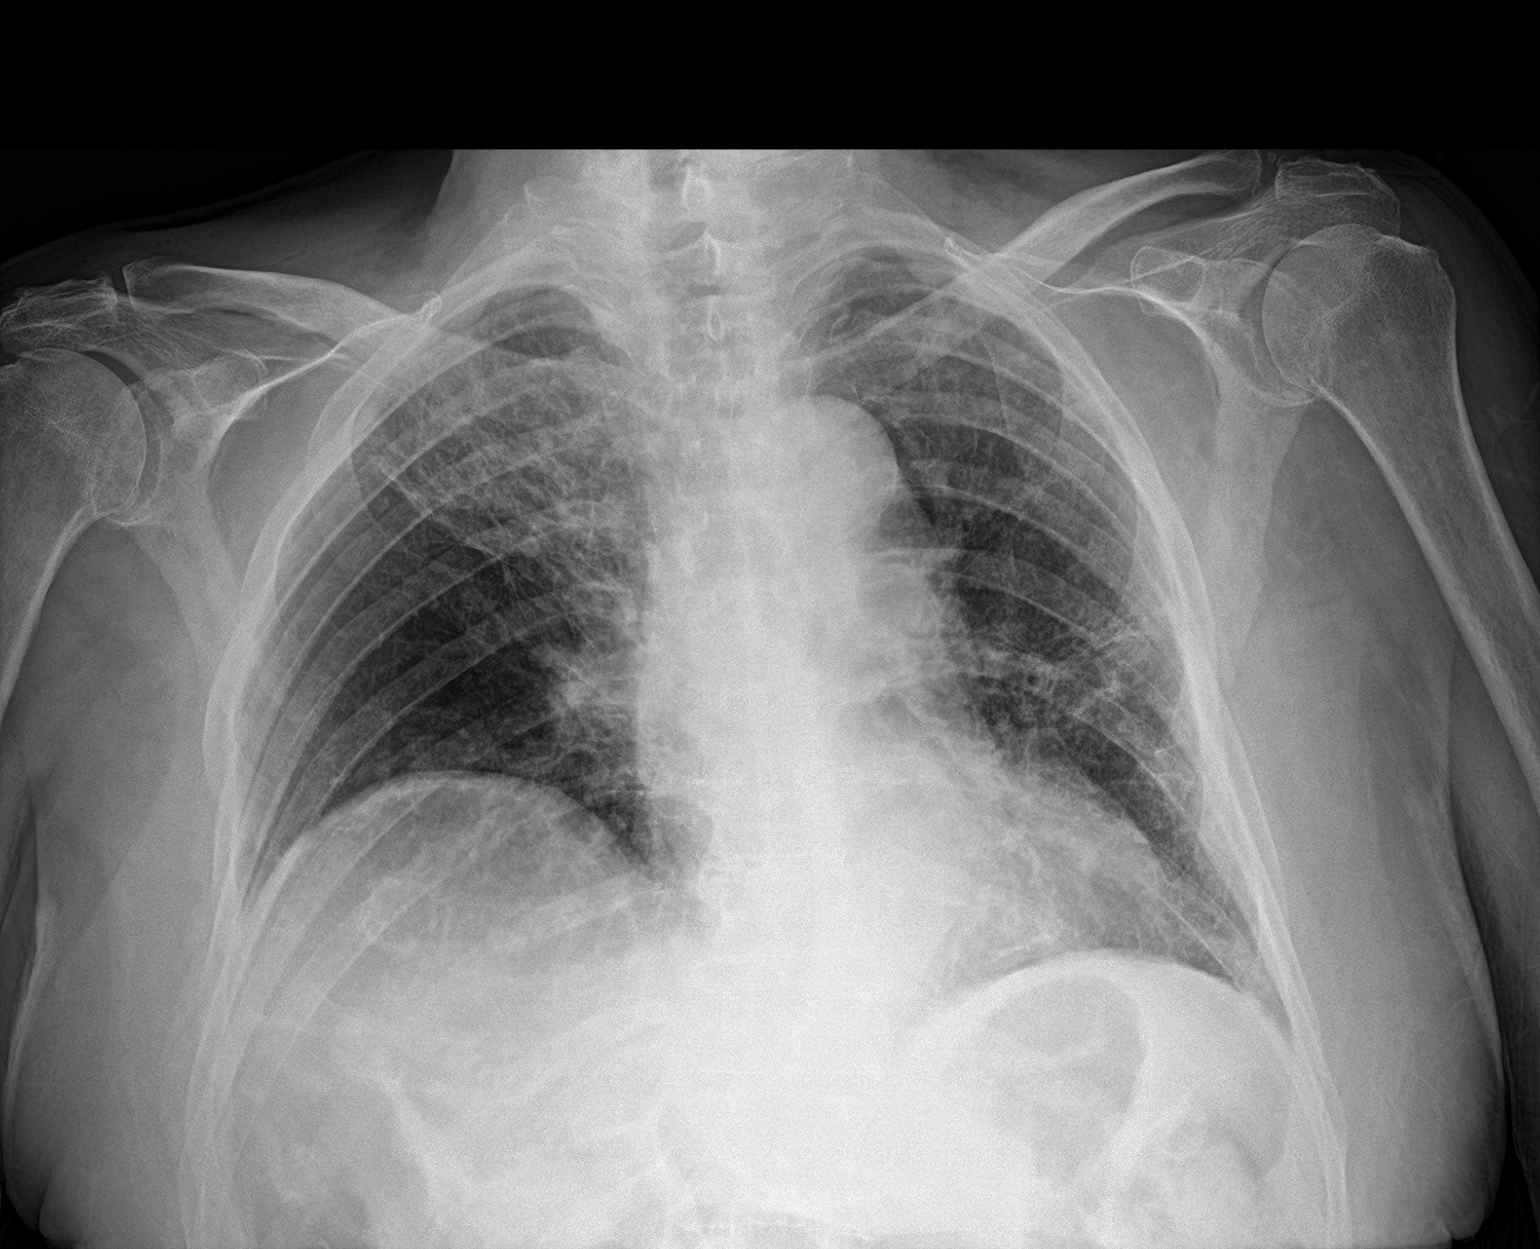

[2 of 2 positions shown; findings below may reference images not displayed]

FINDINGS: Lateral view degraded by patient arm position. Moderate right
hemidiaphragm elevation. Midline trachea. Mild cardiomegaly.
Tortuous thoracic aorta. Atherosclerosis in the transverse aorta. No
pleural effusion or pneumothorax. Right upper lobe interstitial
opacity is progressive since the prior chest radiograph.
interstitial thickening at the left lung base which is favored to
represent scarring and may be slightly increased.
IMPRESSION: Since 8198, significant interval progression of right upper lobe
reticular opacity. Although this is not a typical appearance of
acute pneumonia, this cannot be excluded. Followup PA and lateral
chest X-ray is recommended in 3-4 weeks following trial of
antibiotic therapy to ensure resolution and exclude underlying
malignancy.

Cardiomegaly without congestive failure.

Aortic and branch vessel atherosclerosis.
